# Patient Record
Sex: Male | Born: 1940
Health system: Southern US, Community
[De-identification: ages and names within clinical notes are randomized; demographics above are authoritative.]

## PROBLEM LIST (undated history)

## (undated) DIAGNOSIS — I714 Abdominal aortic aneurysm, without rupture, unspecified: Secondary | ICD-10-CM

## (undated) DIAGNOSIS — I2510 Atherosclerotic heart disease of native coronary artery without angina pectoris: Secondary | ICD-10-CM

## (undated) DIAGNOSIS — R32 Unspecified urinary incontinence: Secondary | ICD-10-CM

## (undated) DIAGNOSIS — N3642 Intrinsic sphincter deficiency (ISD): Secondary | ICD-10-CM

## (undated) DIAGNOSIS — C61 Malignant neoplasm of prostate: Secondary | ICD-10-CM

## (undated) DIAGNOSIS — E785 Hyperlipidemia, unspecified: Secondary | ICD-10-CM

## (undated) DIAGNOSIS — I10 Essential (primary) hypertension: Secondary | ICD-10-CM

## (undated) HISTORY — DX: Hyperlipidemia, unspecified: E78.5

## (undated) HISTORY — PX: BACK SURGERY: SHX140

## (undated) HISTORY — DX: Essential (primary) hypertension: I10

## (undated) HISTORY — PX: ANKLE FRACTURE SURGERY: SHX122

## (undated) HISTORY — PX: CORONARY ANGIOPLASTY: SHX604

## (undated) HISTORY — PX: CORONARY STENT PLACEMENT: SHX1402

## (undated) HISTORY — PX: CARDIAC CATHETERIZATION: SHX172

## (undated) HISTORY — PX: APPENDECTOMY: SHX54

## (undated) HISTORY — DX: Abdominal aortic aneurysm, without rupture: I71.4

## (undated) HISTORY — DX: Abdominal aortic aneurysm, without rupture, unspecified: I71.40

## (undated) HISTORY — PX: NOSE SURGERY: SHX723

## (undated) HISTORY — PX: CHOLECYSTECTOMY: SHX55

## (undated) HISTORY — DX: Atherosclerotic heart disease of native coronary artery without angina pectoris: I25.10

---

## 1996-02-11 HISTORY — PX: PROSTATE SURGERY: SHX751

## 2001-02-10 HISTORY — PX: CORONARY ANGIOPLASTY WITH STENT PLACEMENT: SHX49

## 2006-02-10 HISTORY — PX: ABDOMINAL AORTIC ANEURYSM REPAIR: SUR1152

## 2008-01-03 ENCOUNTER — Ambulatory Visit: Payer: Self-pay | Admitting: Cardiovascular Disease

## 2008-01-14 ENCOUNTER — Ambulatory Visit: Payer: Self-pay

## 2008-06-30 DIAGNOSIS — I2510 Atherosclerotic heart disease of native coronary artery without angina pectoris: Secondary | ICD-10-CM | POA: Insufficient documentation

## 2008-06-30 DIAGNOSIS — Z8679 Personal history of other diseases of the circulatory system: Secondary | ICD-10-CM | POA: Insufficient documentation

## 2008-06-30 DIAGNOSIS — Z8639 Personal history of other endocrine, nutritional and metabolic disease: Secondary | ICD-10-CM

## 2008-06-30 DIAGNOSIS — I10 Essential (primary) hypertension: Secondary | ICD-10-CM | POA: Insufficient documentation

## 2008-06-30 DIAGNOSIS — E119 Type 2 diabetes mellitus without complications: Secondary | ICD-10-CM

## 2008-06-30 DIAGNOSIS — Z862 Personal history of diseases of the blood and blood-forming organs and certain disorders involving the immune mechanism: Secondary | ICD-10-CM

## 2008-07-03 ENCOUNTER — Ambulatory Visit: Payer: Self-pay | Admitting: Cardiovascular Disease

## 2008-12-11 HISTORY — PX: OTHER SURGICAL HISTORY: SHX169

## 2008-12-15 ENCOUNTER — Telehealth: Payer: Self-pay | Admitting: Cardiovascular Disease

## 2008-12-16 ENCOUNTER — Encounter: Payer: Self-pay | Admitting: Cardiovascular Disease

## 2008-12-25 ENCOUNTER — Telehealth (INDEPENDENT_AMBULATORY_CARE_PROVIDER_SITE_OTHER): Payer: Self-pay | Admitting: *Deleted

## 2008-12-25 ENCOUNTER — Ambulatory Visit: Payer: Self-pay | Admitting: Cardiovascular Disease

## 2008-12-26 ENCOUNTER — Encounter (HOSPITAL_COMMUNITY): Admission: RE | Admit: 2008-12-26 | Discharge: 2009-02-06 | Payer: Self-pay | Admitting: Cardiovascular Disease

## 2008-12-26 ENCOUNTER — Ambulatory Visit: Payer: Self-pay | Admitting: Cardiology

## 2008-12-26 ENCOUNTER — Ambulatory Visit: Payer: Self-pay

## 2008-12-27 ENCOUNTER — Telehealth (INDEPENDENT_AMBULATORY_CARE_PROVIDER_SITE_OTHER): Payer: Self-pay | Admitting: *Deleted

## 2009-04-19 ENCOUNTER — Telehealth: Payer: Self-pay | Admitting: Cardiovascular Disease

## 2009-05-09 ENCOUNTER — Ambulatory Visit: Payer: Self-pay | Admitting: Cardiovascular Disease

## 2010-03-12 NOTE — Progress Notes (Signed)
Summary: sob , chestpain  Phone Note Call from Patient Call back at Home Phone [phone]   Caller: Spouse Reason for Call: Talk to Nurse Complaint: Chest Pain, Breathing Problems Details for Reason: Per pt wife calling pt hubsand c/o sob  with activities . cp. had surgery is nov Initial call taken by: Neil Crouch,  April 19, 2009 12:06 PM  Follow-up for Phone Call        S/W wife and they just wanted to make an appointment. The pt is having some SOB and CP at times. He is not having any symptoms today. Appointment made for 05/09/09.  They are aware to call back if he gets worse or feels like he needs to be seen sooner. Wife said this appointment would be fine with them.  Follow-up by: Barnett Abu, RN, BSN,  April 19, 2009 12:16 PM
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
## 2010-05-24 ENCOUNTER — Encounter: Payer: Self-pay | Admitting: Cardiovascular Disease

## 2010-05-28 ENCOUNTER — Ambulatory Visit: Payer: Medicare Other | Admitting: Cardiovascular Disease

## 2010-06-11 ENCOUNTER — Encounter: Payer: Self-pay | Admitting: *Deleted

## 2010-06-11 ENCOUNTER — Encounter: Payer: Self-pay | Admitting: Cardiovascular Disease

## 2010-06-11 ENCOUNTER — Ambulatory Visit (INDEPENDENT_AMBULATORY_CARE_PROVIDER_SITE_OTHER): Payer: Medicare Other | Admitting: Cardiovascular Disease

## 2010-06-11 DIAGNOSIS — R609 Edema, unspecified: Secondary | ICD-10-CM

## 2010-06-11 DIAGNOSIS — Z79899 Other long term (current) drug therapy: Secondary | ICD-10-CM

## 2010-06-11 DIAGNOSIS — Z01810 Encounter for preprocedural cardiovascular examination: Secondary | ICD-10-CM

## 2010-06-11 DIAGNOSIS — I739 Peripheral vascular disease, unspecified: Secondary | ICD-10-CM | POA: Insufficient documentation

## 2010-06-11 DIAGNOSIS — M79609 Pain in unspecified limb: Secondary | ICD-10-CM

## 2010-06-11 DIAGNOSIS — R079 Chest pain, unspecified: Secondary | ICD-10-CM

## 2010-06-11 DIAGNOSIS — M79606 Pain in leg, unspecified: Secondary | ICD-10-CM

## 2010-06-11 LAB — CBC WITH DIFFERENTIAL/PLATELET
Basophils Relative: 0.2 % (ref 0.0–3.0)
Eosinophils Absolute: 0 10*3/uL (ref 0.0–0.7)
Hemoglobin: 13.1 g/dL (ref 13.0–17.0)
MCHC: 35.3 g/dL (ref 30.0–36.0)
MCV: 91.9 fl (ref 78.0–100.0)
Monocytes Absolute: 0.3 10*3/uL (ref 0.1–1.0)
Neutro Abs: 3.6 10*3/uL (ref 1.4–7.7)
RBC: 4.03 Mil/uL — ABNORMAL LOW (ref 4.22–5.81)

## 2010-06-11 LAB — BASIC METABOLIC PANEL
BUN: 14 mg/dL (ref 6–23)
CO2: 26 mEq/L (ref 19–32)
Chloride: 100 mEq/L (ref 96–112)
Glucose, Bld: 343 mg/dL — ABNORMAL HIGH (ref 70–99)
Potassium: 4.4 mEq/L (ref 3.5–5.1)

## 2010-06-11 MED ORDER — POTASSIUM CHLORIDE 10 MEQ PO TBCR: 10.0000 meq | EXTENDED_RELEASE_TABLET | Freq: Every day | ORAL | Status: DC

## 2010-06-11 MED ORDER — FUROSEMIDE 20 MG PO TABS: 20.0000 mg | ORAL_TABLET | Freq: Every day | ORAL | Status: DC

## 2010-06-11 NOTE — Assessment & Plan Note (Signed)
Needs referral to [facility]  [facility] or [facility].  ABI's and [place] per their recommendation.

## 2010-06-11 NOTE — Patient Instructions (Addendum)
Your physician recommends that you schedule a follow-up appointment in: AFTER CATH  Your physician has requested that you have a cardiac catheterization. Cardiac catheterization is used to diagnose and/or treat various heart conditions. Doctors may recommend this procedure for a number of different reasons. The most common reason is to evaluate chest pain. Chest pain can be a symptom of coronary artery disease (CAD), and cardiac catheterization can show whether plaque is narrowing or blocking your heart's arteries. This procedure is also used to evaluate the valves, as well as measure the blood flow and oxygen levels in different parts of your heart. For further information please visit [facility]uge[facility]iesta[facility]tn. Please follow instruction sheet, as given.   Your physician has recommended you make the following change in your medication: ADD FUROSEMIDE 20 MG EVERY DAY AND KLOR CON 10 MEQ EVERY DAY.  You have been referred to VASCULAR  DR EARLY ,[place]   Your physician recommends that you return for lab work in: [place] [facility] [place] V72.81

## 2010-06-11 NOTE — Assessment & Plan Note (Signed)
Well controlled.  Continue current medications and low sodium Dash type diet.    

## 2010-06-11 NOTE — Assessment & Plan Note (Signed)
Add lasix and potassium  Discussed weight loss, compression hose and elevating legs at end of day

## 2010-06-11 NOTE — Assessment & Plan Note (Signed)
Previous stents to circumflex.  Recurrent SSCP relieved with nitro.  Cath to be arranged

## 2010-06-11 NOTE — Progress Notes (Signed)
Max Anderson is seen today for F/U of CAD and PVC   He has had a AAA repair and left popliteal aneurysm reapair. Max Anderson  He has CAD with previous stenting of the circ in [place]/[facility] ? 2006 He has never been cathed here in [place].  He is very sedentary and only walks from "den to [employer]    He had a normal myovue here in 12/2008  He continues to have exertional chest pain.  Taking nitro fairly regularly.  H2 blocker has not helped as much as we thought it should.  Given known CAD with "angina" at low work load needs cath.  Hopefully can do from right radial given PVD/AAA repair and obesity. Also needs vascular referral.  Does not want to F/U at [facility].  Continued pain and LE edema in right leg were popliteal aneurysm was.  Needs diuretic for edema related to surgery and obesity.  ROS: Denies fever, malais, weight loss, blurry vision, decreased visual acuity, cough, sputum, SOB, hemoptysis, pleuritic pain, palpitaitons, heartburn, abdominal pain, melena, lower extremity edema, claudication, or rash.   General: Affect appropriate Healthy:  appears stated age 70: normal Neck supple with no adenopathy JVP normal no bruits no thyromegaly Lungs clear with no wheezing and good diaphragmatic motion Heart:  S1/S2 no murmur,rub, gallop or click PMI normal Abdomen: benighn, BS positve, no tenderness,  AAA repair no bruit.  No HSM or HJR Distal pulses intact with no bruits Bilateral  edema Neuro non-focal Skin warm and dry No muscular weakness   Current Outpatient Prescriptions  Medication Sig Dispense Refill  . allopurinol (ZYLOPRIM) 300 MG tablet Take 300 mg by mouth daily.        Max Anderson amLODipine-olmesartan (AZOR) 5-20 MG per tablet Take 1 tablet by mouth daily.        Max Anderson aspirin 81 MG tablet Take 81 mg by mouth daily.        Max Anderson atenolol (TENORMIN) 25 MG tablet Take 25 mg by mouth daily.        . clopidogrel (PLAVIX) 75 MG tablet Take 75 mg by mouth daily.        Max Anderson gemfibrozil (LOPID) 600 MG tablet  Take 600 mg by mouth 2 (two) times daily before a meal.        . HYDROcodone-acetaminophen (VICODIN) 5-500 MG per tablet Take 1 tablet by mouth every 6 (six) hours as needed.        Max Anderson PARoxetine (PAXIL) 40 MG tablet Take 40 mg by mouth every morning.          Allergies  Review of patient's allergies indicates not on file.  Electrocardiogram:  NSR 75 poor R wave progression  Assessment and Plan

## 2010-06-18 ENCOUNTER — Inpatient Hospital Stay (HOSPITAL_BASED_OUTPATIENT_CLINIC_OR_DEPARTMENT_OTHER)
Admission: RE | Admit: 2010-06-18 | Discharge: 2010-06-18 | Disposition: A | Discharge status: Home / Self Care | Payer: Medicare Other | Source: Ambulatory Visit | Attending: Cardiovascular Disease | Admitting: Cardiovascular Disease

## 2010-06-18 DIAGNOSIS — I2510 Atherosclerotic heart disease of native coronary artery without angina pectoris: Secondary | ICD-10-CM

## 2010-06-18 DIAGNOSIS — I209 Angina pectoris, unspecified: Secondary | ICD-10-CM | POA: Insufficient documentation

## 2010-06-18 DIAGNOSIS — Z9861 Coronary angioplasty status: Secondary | ICD-10-CM | POA: Insufficient documentation

## 2010-06-18 LAB — POCT I-STAT GLUCOSE
Glucose, Bld: 185 mg/dL — ABNORMAL HIGH (ref 70–99)
Operator id: 194801

## 2010-06-19 ENCOUNTER — Telehealth: Payer: Self-pay | Admitting: Cardiology

## 2010-06-19 NOTE — Telephone Encounter (Signed)
Per Dr. [name], patient is set up for an intervention with Dr. Burt Knack on 06/21/10 @ 10:30am in the main cath lab. He is aware.

## 2010-06-21 ENCOUNTER — Observation Stay (HOSPITAL_COMMUNITY)
Admission: RE | Admit: 2010-06-21 | Discharge: 2010-06-21 | Disposition: A | Discharge status: Home / Self Care | Payer: Medicare Other | Source: Ambulatory Visit | Attending: Cardiovascular Disease | Admitting: Cardiovascular Disease

## 2010-06-21 DIAGNOSIS — Z9861 Coronary angioplasty status: Secondary | ICD-10-CM | POA: Insufficient documentation

## 2010-06-21 DIAGNOSIS — I2541 Coronary artery aneurysm: Secondary | ICD-10-CM | POA: Insufficient documentation

## 2010-06-21 DIAGNOSIS — Z01810 Encounter for preprocedural cardiovascular examination: Secondary | ICD-10-CM | POA: Insufficient documentation

## 2010-06-21 DIAGNOSIS — Z01812 Encounter for preprocedural laboratory examination: Secondary | ICD-10-CM | POA: Insufficient documentation

## 2010-06-21 DIAGNOSIS — I2510 Atherosclerotic heart disease of native coronary artery without angina pectoris: Principal | ICD-10-CM | POA: Insufficient documentation

## 2010-06-21 DIAGNOSIS — Z79899 Other long term (current) drug therapy: Secondary | ICD-10-CM | POA: Insufficient documentation

## 2010-06-21 DIAGNOSIS — I739 Peripheral vascular disease, unspecified: Secondary | ICD-10-CM | POA: Insufficient documentation

## 2010-06-21 LAB — CBC
Platelets: 156 10*3/uL (ref 150–400)
RDW: 14.9 % (ref 11.5–15.5)
WBC: 5.8 10*3/uL (ref 4.0–10.5)

## 2010-06-21 LAB — BASIC METABOLIC PANEL
Chloride: 98 mEq/L (ref 96–112)
Creatinine, Ser: 1.2 mg/dL (ref 0.4–1.5)
GFR calc Af Amer: >60 mL/min (ref >60)
Potassium: 4.8 mEq/L (ref 3.5–5.1)
Sodium: 138 mEq/L (ref 135–145)

## 2010-06-21 LAB — GLUCOSE, CAPILLARY
Glucose-Capillary: 262 mg/dL — ABNORMAL HIGH (ref 70–99)
Glucose-Capillary: 189 mg/dL — ABNORMAL HIGH (ref 70–99)

## 2010-06-21 LAB — POCT ACTIVATED CLOTTING TIME: Activated Clotting Time: 240 seconds

## 2010-06-25 ENCOUNTER — Encounter (INDEPENDENT_AMBULATORY_CARE_PROVIDER_SITE_OTHER): Payer: Medicare Other | Admitting: Vascular Surgery

## 2010-06-25 ENCOUNTER — Encounter (INDEPENDENT_AMBULATORY_CARE_PROVIDER_SITE_OTHER): Payer: Medicare Other

## 2010-06-25 ENCOUNTER — Other Ambulatory Visit: Payer: Self-pay | Admitting: Vascular Surgery

## 2010-06-25 DIAGNOSIS — I724 Aneurysm of artery of lower extremity: Secondary | ICD-10-CM

## 2010-06-25 DIAGNOSIS — I70219 Atherosclerosis of native arteries of extremities with intermittent claudication, unspecified extremity: Secondary | ICD-10-CM

## 2010-06-25 DIAGNOSIS — I714 Abdominal aortic aneurysm, without rupture: Secondary | ICD-10-CM

## 2010-06-25 DIAGNOSIS — Z48812 Encounter for surgical aftercare following surgery on the circulatory system: Secondary | ICD-10-CM

## 2010-06-25 DIAGNOSIS — M79609 Pain in unspecified limb: Secondary | ICD-10-CM
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
## 2010-06-26 NOTE — Consult Note (Signed)
NEW PATIENT CONSULTATION  RUFFIN, BERZINS DOB:  July 27, 1940                                       06/25/2010 CHART#:[id number]  This patient presents today for evaluation of his extensive peripheral vascular occlusive disease.  He has had all his prior treatments at [facility].  He lives [place] and had his initial care in [place] with referral to [place].  He has a long history of insulin dependent diabetes dating back nearly 25 years, has hypertension, elevated cholesterol.  He has had multiple prior coronary stents.  PRIOR SURGICAL HISTORY:  Prostate cancer surgery, extensive back surgery, stent graft repair of abdominal aortic aneurysm in 2007 at [facility] and also above-knee to below-knee popliteal bypass for right popliteal artery aneurysm at [facility] in November 2010.  He reports difficulty with walking since the right leg bypass with difficulty in raising his foot. He does have a history of prior appendectomy and cholecystectomy.  He also had multiple ankle surgeries for fractured ankle on the right.  SOCIAL HISTORY:  He is married with 3 children.  He does not smoke, having quit in 1990.  He does not drink alcohol.  FAMILY HISTORY:  Myocardial infarction in his mother; father died at a young age, 76.  He has diabetes and heart disease in his brother and sisters.  REVIEW OF SYSTEMS:  Weight gain up to 289 pounds, he is 5 feet 10 inches tall. VASCULAR:  Positive for pain in his legs with walking. CARDIAC:  Chest pain, chest tightness, shortness of breath with exertion. NEUROLOGIC:  Dizziness. URINARY:  Does have urinary prosthesis. ENT:  Change in eyesight and hearing. MUSCULOSKELETAL:  Positive for arthritis, joint pain, and muscle pain. PSYCHIATRIC:  Depression and anxiety. SKIN:  Negative. Other review of systems is negative.  PHYSICAL EXAMINATION:  An obese white male, appearing stated age, in no acute distress.  Blood pressure 125/77,  pulse 70, respirations 18. HEENT:  Normal.  His carotid arteries are without bruits bilaterally. Has 2+ radial, 2+ femoral pulses.  He does have palpable popliteal pulses bilaterally.  Chest:  Clear without rales, rhonchi or wheezes. Heart:  Regular rate and rhythm.  Abdomen:  Obese.  I do not feel an aneurysm.  I do not feel any masses.  He has no tenderness. Musculoskeletal:  Shows no major deformity or cyanosis.  Neurologic:  No focal weakness or paresthesias.  Skin:  Without ulcers or rashes.  I do have records for review from Dr. [name].  He did have a recent cath showing some recurrent stenosis in his LAD with some aneurysmal change.  He underwent noninvasive vascular laboratory studies in our office and this reveals normal ankle arm index bilaterally.  He does have some widening of the popliteal artery at the lower segment of his bypass on the right but not really aneurysmal.  He does have normal triphasic waveforms in his lower extremities.  On further discussion with the patient and his wife, he apparently has had no recent CT scan follow-up of the stent graft repair of abdominal aortic aneurysm done at [facility] in 2007.  I explained that the accepted protocol for this is lifelong serial follow-up with CT.  We will schedule this at his convenience to be done at [facility] and will contact him regarding this.  Assuming this is normal, we would recommend CT scan  every 2 years to rule out any change in his aneurysm. We will notify him following his CT.    Rosetta Posner, M.D. Electronically Signed  TFE/MEDQ  D:  06/25/2010  T:  06/26/2010  Job:  [id number]  cc:   Wallis Bamberg. [name], MD, [facility]. Willey Blade, MD

## 2010-06-27 ENCOUNTER — Ambulatory Visit (HOSPITAL_COMMUNITY): Payer: Medicare Other

## 2010-07-03 NOTE — Procedures (Unsigned)
BYPASS GRAFT EVALUATION  INDICATION:  Follow up right popliteal aneurysm and right femoral- popliteal bypass graft.  HISTORY: Diabetes:  Yes. Cardiac: Hypertension:  Yes. Smoking:  Previous. Previous Surgery:  AAA repair in 2008, right femoral-to-popliteal bypass graft in 2010.  SINGLE LEVEL ARTERIAL EXAM                              RIGHT              LEFT Brachial:                    124                140 Anterior tibial:             148                146 Posterior tibial:            153                160 Peroneal: Ankle/brachial index:        1.09               1.14  PREVIOUS ABI:  Date:  RIGHT:  LEFT:  LOWER EXTREMITY BYPASS GRAFT DUPLEX EXAM:  DUPLEX: 1. No hemodynamically significant stenosis/plaque identified in the     right lower extremity arterial system. 2. Previous popliteal aneurysm appears occluded and measures 1.12 cm X     1.24 cm. 3. Mild dilatation noted at the distal anastomosis of the right     femoral-to-popliteal bypass graft measuring 1.28 cm X 1.44 cm     without intramural thrombus present.  IMPRESSION: 1. No evidence of stenosis, as noted above. 2. Dilatations are present, as noted above. 3. Ankle brachial indices are >0.95 and are considered normal.      ___________________________________________ Rosetta Posner, M.D.  SH/MEDQ  D:  06/25/2010  T:  06/25/2010  Job:  [id number]

## 2010-07-03 NOTE — Cardiovascular Report (Signed)
Max Anderson, Max Anderson               ACCOUNT NO.:  [id number]  MEDICAL RECORD NO.:  [id number]          PATIENT TYPE:  LOCATION:                                 FACILITY:  PHYSICIAN:  Arletha Marschke C. [name], MD, FACCDATE OF BIRTH:  Aug 07, 1940  DATE OF PROCEDURE:  06/18/2010 DATE OF DISCHARGE:                           CARDIAC CATHETERIZATION   A 70 year old patient previous cardiac care in [place].  The patient has had previous stents.  We did not know all the details of where the stents had previously been placed.  He presented to our office with classic angina.  He is status post AAA repair and popliteal aneurysm repair.  We elected to use 5-French system from the right radial artery.  The patient received 4500 units of heparin, 3 mg of verapamil, 25 mcg of fentanyl and 2 mg of Versed.  Standard JR-4 and JL-3.5 catheters were used to engage the coronaries. A standard pigtail was used to cross the valve.  Left main coronary artery had a 20% discrete stenosis.  Left anterior descending artery had a 70-80% discrete stenosis.  Right after this stenosis was a very large aneurysmal segment to the proximal LAD.  The mid LAD had diffuse 40-50% tubular disease inside of a small stent.  The distal LAD had 20-30% multiple discrete lesions and was a large vessel that reached the apex.  The first, second, and third diagonal branches had minor disease and the mid vessel stent appeared to be placed just distal to the first diagonal branch.  Circumflex coronary artery was nondominant.  There was a 40% discrete lesion at the takeoff of the first obtuse marginal branch.  The second obtuse marginal branch were what would be the continuation of the AV groove branch was a very large vessel.  There was a widely patent stent in this AV groove or second obtuse marginal branch.  There was some tortuosity at the takeoff of the first obtuse marginal branch but I do not believe there was a significant  stenosis.  They are probably only in the 40% range at the bifurcation with the stent being widely patent distal to that.  The right coronary artery was dominant.  There also appeared to be a stent in the proximal portion of the right coronary artery.  There was 20-30% residual disease in the proximal portion, the midvessel had 40% tubular disease.  Interestingly, the entire right coronary artery also had aneurysmal segments.  The distal right coronary artery had 30-40% multiple discrete lesions surrounding two aneurysmal segments.  There was a large posterolateral branch and PDA without significant disease.  RAO ventriculography:  RAO ventriculography showed hyperdynamic function.  EF was 65%.  There was no gradient across the aortic valve and no MR.  Aortic pressure was 148/68, LV pressure was 148/18.  IMPRESSION:  I have asked Dr. Gerrit Halls to review the films.  I suspect that they will be able to stent the proximal LAD although the aneurysmal segment presents problem.  He has done well with his other stents.  He must have a genotype for aneurysm since he has also had aneurysms in his abdomen  and popliteal artery.  I wonder if he also should have an MRA of the central circulation to make sure he is not risk for CNS bleed.  He tolerated the procedure well.  At the end of the case, the TR band was placed with good hemostasis.     Max Anderson. [name], MD, [facility]     PCN/MEDQ  D:  06/18/2010  T:  06/18/2010  Job:  [id number]  Electronically Signed by Jenkins Rouge MD [facility] on 07/03/2010 03:41:52 PM

## 2010-07-09 ENCOUNTER — Encounter (HOSPITAL_COMMUNITY): Payer: Self-pay

## 2010-07-09 ENCOUNTER — Ambulatory Visit (HOSPITAL_COMMUNITY)
Admission: RE | Admit: 2010-07-09 | Discharge: 2010-07-09 | Disposition: A | Discharge status: Home / Self Care | Payer: Medicare Other | Source: Ambulatory Visit | Attending: Vascular Surgery | Admitting: Vascular Surgery

## 2010-07-09 DIAGNOSIS — I714 Abdominal aortic aneurysm, without rupture, unspecified: Secondary | ICD-10-CM | POA: Insufficient documentation

## 2010-07-09 MED ORDER — IOHEXOL 350 MG/ML SOLN
100.0000 mL | Freq: Once | INTRAVENOUS | Status: AC | PRN
  Administered 2010-07-09: 100 mL via INTRAVENOUS

## 2010-07-10 ENCOUNTER — Encounter: Payer: Self-pay | Admitting: Cardiovascular Disease

## 2010-07-10 ENCOUNTER — Encounter: Payer: Medicare Other | Admitting: Cardiovascular Disease

## 2010-07-16 ENCOUNTER — Other Ambulatory Visit (HOSPITAL_COMMUNITY): Payer: Self-pay | Admitting: Internal Medicine

## 2010-07-16 ENCOUNTER — Ambulatory Visit (HOSPITAL_COMMUNITY)
Admission: RE | Admit: 2010-07-16 | Discharge: 2010-07-16 | Disposition: A | Discharge status: Home / Self Care | Payer: Medicare Other | Source: Ambulatory Visit | Attending: Internal Medicine | Admitting: Internal Medicine

## 2010-07-16 DIAGNOSIS — W19XXXA Unspecified fall, initial encounter: Secondary | ICD-10-CM

## 2010-07-16 DIAGNOSIS — M25562 Pain in left knee: Secondary | ICD-10-CM

## 2010-07-16 DIAGNOSIS — M25469 Effusion, unspecified knee: Secondary | ICD-10-CM | POA: Insufficient documentation

## 2010-07-16 DIAGNOSIS — M25569 Pain in unspecified knee: Secondary | ICD-10-CM | POA: Insufficient documentation

## 2010-07-23 ENCOUNTER — Ambulatory Visit (INDEPENDENT_AMBULATORY_CARE_PROVIDER_SITE_OTHER): Payer: Medicare Other | Admitting: Cardiovascular Disease

## 2010-07-23 ENCOUNTER — Encounter: Payer: Self-pay | Admitting: Cardiovascular Disease

## 2010-07-23 DIAGNOSIS — I10 Essential (primary) hypertension: Secondary | ICD-10-CM

## 2010-07-23 DIAGNOSIS — R079 Chest pain, unspecified: Secondary | ICD-10-CM

## 2010-07-23 DIAGNOSIS — I2510 Atherosclerotic heart disease of native coronary artery without angina pectoris: Secondary | ICD-10-CM

## 2010-07-23 DIAGNOSIS — Z8679 Personal history of other diseases of the circulatory system: Secondary | ICD-10-CM

## 2010-07-23 MED ORDER — ISOSORBIDE MONONITRATE ER 30 MG PO TB24: 30.0000 mg | ORAL_TABLET | Freq: Every day | ORAL | Status: DC

## 2010-07-23 NOTE — Assessment & Plan Note (Signed)
F/U Dr Donnetta Hutching.  CT end of may stable with no endoleak

## 2010-07-23 NOTE — Assessment & Plan Note (Signed)
Well controlled.  Continue current medications and low sodium Dash type diet.    

## 2010-07-23 NOTE — Patient Instructions (Signed)
Your physician recommends that you schedule a follow-up appointment in: 3 months with Dr. [name]  Your physician has requested that you have a lexiscan myoview. For further information please visit [email or web address]. Please follow instruction sheet, as given. In 3 months when you see Dr. [name].  Your physician has recommended you make the following change in your medication: START IMDUR 30mg  daily.

## 2010-07-23 NOTE — Progress Notes (Signed)
Max Anderson is seen today for F/U of CAD and PVC He has had a AAA repair and left popliteal aneurysm reapair. Max Anderson He has CAD with previous stenting of the circ in [place]/[facility] ? 2006  He has never been cathed here in [place]. He is very sedentary and only walks from "den to [employer] He had a normal myovue here in 12/2008 He continues to have exertional chest pain. Taking nitro fairly regularly. H2 blocker has not helped as much as we thought it should. Given known CAD with "angina" at low work load cath was done first week in May.  He had a significant  promimal LAD lesion with proximal aneurysm 3 days latter I had Dr Burt Knack evaluate with flow wire and he did not meed criteria for intervention.  Still concerned Had had to take nitro 2 times since cath.  Will add long acting nitro and F/U lexiscan only myovue in 3 months.  Will likely need CABG in the next year or two at the latest  Has seen Dr Early for vascular F/U  Reviewed notes. CT 5/29 with residula 3.4 cm aneurysm sac and no endoleak  Does not want to F/U at [facility]. Continued pain and LE edema in right leg were popliteal aneurysm was. Needs diuretic for edema related to surgery and obesity.  Reviewed angiograms with patient and wife.    ROS: Denies fever, malais, weight loss, blurry vision, decreased visual acuity, cough, sputum, SOB, hemoptysis, pleuritic pain, palpitaitons, heartburn, abdominal pain, melena, lower extremity edema, claudication, or rash.  All other systems reviewed and negative  General: Affect appropriate Chronically ill appearing HEENT: normal Neck supple with no adenopathy JVP normal no bruits no thyromegaly Lungs clear with no wheezing and good diaphragmatic motion Heart:  S1/S2 no murmur,rub, gallop or click PMI normal Abdomen: benighn, BS positve, no tenderness, no AAA no bruit.  No HSM or HJR Distal pulses intact with no bruits No edema Neuro non-focal Skin warm and dry No muscular weakness Obese with difficulty  ambulating   Current Outpatient Prescriptions  Medication Sig Dispense Refill  . allopurinol (ZYLOPRIM) 300 MG tablet Take 300 mg by mouth daily.        Max Anderson amLODipine-olmesartan (AZOR) 5-20 MG per tablet Take 1 tablet by mouth daily.        Max Anderson aspirin 81 MG tablet Take 81 mg by mouth daily.        Max Anderson atenolol (TENORMIN) 25 MG tablet Take 25 mg by mouth daily.        Max Anderson buPROPion (BUPROBAN) 150 MG 12 hr tablet Take 150 mg by mouth 2 (two) times daily.        . clonazePAM (KLONOPIN) 0.5 MG tablet 0.5 mg as needed.        . clopidogrel (PLAVIX) 75 MG tablet Take 75 mg by mouth daily.        . furosemide (LASIX) 20 MG tablet Take 1 tablet (20 mg total) by mouth daily.  30 tablet  11  . HYDROcodone-acetaminophen (LORTAB 5) 5-500 MG per tablet Take 1 tablet by mouth every 6 (six) hours as needed.        . insulin NPH-insulin regular (HUMULIN 70/30) (70-30) 100 UNIT/ML injection 3 (three) times daily. 65-45-75       . nitroGLYCERIN (NITROSTAT) 0.4 MG SL tablet Place 0.4 mg under the tongue every 5 (five) minutes as needed.        Max Anderson PARoxetine (PAXIL) 40 MG tablet Take 40 mg by mouth every morning.        Max Anderson  potassium chloride (KLOR-CON 10) 10 MEQ CR tablet Take 1 tablet (10 mEq total) by mouth daily.  30 tablet  11    Allergies  Niaspan and Tolectin  Electrocardiogram:  NSR 75 Poor R wave progression no acute changes  Assessment and Plan

## 2010-07-23 NOTE — Assessment & Plan Note (Signed)
Discussed and reviewed with Dr Burt Knack.  Add long acting nitro.  FFR not clearing flow limiting .  Having some angina  ECG ok.  Has lost about 10 lbs since cath.  Lexiscan only myovue to R/O ischemia anteriorly in 3 months.  Low threshold for CABG if symptoms worsen

## 2010-07-24 ENCOUNTER — Encounter: Payer: Self-pay | Admitting: *Deleted

## 2010-07-31 ENCOUNTER — Other Ambulatory Visit (HOSPITAL_COMMUNITY): Payer: Medicare Other | Admitting: Radiology

## 2010-07-31 ENCOUNTER — Ambulatory Visit (HOSPITAL_COMMUNITY): Payer: Medicare Other | Attending: Cardiovascular Disease | Admitting: Radiology

## 2010-07-31 DIAGNOSIS — R0602 Shortness of breath: Secondary | ICD-10-CM

## 2010-07-31 DIAGNOSIS — I2510 Atherosclerotic heart disease of native coronary artery without angina pectoris: Secondary | ICD-10-CM

## 2010-07-31 DIAGNOSIS — R079 Chest pain, unspecified: Secondary | ICD-10-CM | POA: Insufficient documentation

## 2010-07-31 MED ORDER — TECHNETIUM TC 99M TETROFOSMIN IV KIT
11.0000 | PACK | Freq: Once | INTRAVENOUS | Status: AC | PRN
  Administered 2010-07-31: 11 via INTRAVENOUS

## 2010-07-31 MED ORDER — TECHNETIUM TC 99M TETROFOSMIN IV KIT
33.0000 | PACK | Freq: Once | INTRAVENOUS | Status: AC | PRN
  Administered 2010-07-31: 33 via INTRAVENOUS

## 2010-07-31 MED ORDER — REGADENOSON 0.4 MG/5ML IV SOLN
0.4000 mg | Freq: Once | INTRAVENOUS | Status: AC
  Administered 2010-07-31: 0.4 mg via INTRAVENOUS

## 2010-07-31 NOTE — Progress Notes (Signed)
[facility] NUCLEAR MED [place] [address] [phone]  Cardiology Nuclear Med Study  Max Anderson is a 70 y.o. male [id number] 12-04-1940   Nuclear Med Background Indication for Stress Test:  Evaluation for Ischemia and Stent Patency History: 06/19/10 Heart Catheterization: mod proximal 70-80% LAD stenosis, '10 Myocardial Perfusion Study: NL and 06/07 Stents: RCA/CFX, 2008 AAA (L) pop repair, 05/29 showing residual 3.4cm aneurysm, 2010 Femoral/Pop Bypass/Graft Cardiac Risk Factors: Family History - CAD, Hypertension, IDDM Type 2, Lipids and PVD  Symptoms:  Chest Pain, DOE, Fatigue, Fatigue with Exertion, Light-Headedness and SOB   Nuclear Pre-Procedure Caffeine/Decaff Intake:  None NPO After: 5:00pm   Lungs:  clear IV 0.9% NS with Angio Cath:  20g  IV Site: R Antecubital  IV Started by:  Irven Baltimore, RN  Chest Size (in):  56 Cup Size: n/a  Height: 5\' 10"  (1.778 m)  Weight:  278 lb (126.1 kg)  BMI:  Body mass index is 39.89 kg/(m^2). Tech Comments:  Held atenolol x 24 hrs    Nuclear Med Study 1 or 2 day study: 1 day  Stress Test Type:  Carlton Adam  Reading MD: Dola Argyle, MD  Order Authorizing Provider:  P.Nishan  Resting Radionuclide: Technetium 56m Tetrofosmin  Resting Radionuclide Dose: 11 mCi   Stress Radionuclide:  Technetium 25m Tetrofosmin  Stress Radionuclide Dose: 33 mCi           Stress Protocol Rest HR: 64 Stress HR: 72  Rest BP: 136/68 Stress BP: 146/64  Exercise Time (min): n/a METS: n/a   Predicted Max HR: 151 bpm % Max HR: 48.34 bpm Rate Pressure Product: 10658   Dose of Adenosine (mg):  n/a Dose of Lexiscan: 0.4 mg  Dose of Atropine (mg): n/a Dose of Dobutamine: n/a mcg/kg/min (at max HR)  Stress Test Technologist: Perrin Maltese, EMT-P  Nuclear Technologist:  Charlton Amor, CNMT     Rest Procedure:  Myocardial perfusion imaging was performed at rest 45 minutes following the intravenous  administration of Technetium 10m Tetrofosmin. Rest ECG: NSR  Stress Procedure:  The patient received IV Lexiscan 0.4 mg over 15-seconds.  Technetium 27m Tetrofosmin injected at 30-seconds.  There were no significant changes with Lexiscan.  Quantitative spect images were obtained after a 45 minute delay. Stress ECG: No significant change from baseline ECG  QPS Raw Data Images:  Patient motion noted; appropriate software correction applied. Stress Images:  No diagnostic abnormalities. Rest Images:  Same as stress Subtraction (SDS):  No evidence of ischemia. Transient Ischemic Dilatation (Normal <1.22):  1.05 Lung/Heart Ratio (Normal <0.45):  .36  Quantitative Gated Spect Images QGS EDV:  119 ml QGS ESV:  41 ml QGS cine images:  Normal Wall Motion QGS EF: 66%  Impression Exercise Capacity:  Lexiscan with no exercise. BP Response:  Normal blood pressure response. Clinical Symptoms:  SOB ECG Impression:  No significant ST segment change suggestive of ischemia. Comparison with Prior Nuclear Study: No significant change from previous study  Overall Impression:  Normal stress nuclear study.   Dola Argyle

## 2010-08-01 NOTE — Progress Notes (Signed)
Copy nuc med report routed to Dr. [name] 08/01/10 Charlton Amor

## 2010-08-01 NOTE — Cardiovascular Report (Signed)
  NAME:  AVARY, GOODE NO.:  [id number]  MEDICAL RECORD NO.:  [id number]           PATIENT TYPE:  O  LOCATION:  6522                         FACILITY:  [facility]  PHYSICIAN:  Juanda Bond. Burt Knack, MD  DATE OF BIRTH:  October 31, 1940  DATE OF PROCEDURE:  06/21/2010 DATE OF DISCHARGE:  06/21/2010                           CARDIAC CATHETERIZATION   PROCEDURE:  Pressure wire analysis of the LAD.  PROCEDURAL INDICATIONS:  Mr. Monaco is a morbidly obese 70 year old gentleman with coronary artery disease and peripheral arterial disease. He has had previous coronary stenting and presented to see Dr. [name] with progressive angina.  He underwent diagnostic catheterization earlier this week demonstrating moderately tight stenosis of the proximal LAD leading into an aneurysmal segment of LAD.  He had patency of stents in this left circumflex and further down in the mid-LAD.  The right coronary artery has ectasia and moderate diffuse plaque without high-grade stenosis.  After review of his films, we elected to bring him in for pressure wire analysis of the LAD to see if he was ischemic in that region.  I did not feel that he was a good candidate for percutaneous stenting because we would have to treat the lesion into the aneurysmal segment.  PROCEDURAL DETAILS:  Risks and indications of procedure were reviewed with the patient, informed consent was obtained.  The right wrist was prepped, draped, and anesthetized with 1% lidocaine using modified Seldinger technique.  A 6-French sheath was placed in the right radial artery.  7000 units of unfractionated heparin was administered intravenously, 3 mg of verapamil was given through the sheath.  A JL-4 cm guide catheter was inserted.  The ACT was 240 seconds.  The pressure wire was zeroed and normalized then advanced easily beyond the lesion and beyond the aneurysmal segment into the distal LAD.  Intravenous adenosine was administered for  about 3 minutes and at peak hyperemia, the FFR initially was 0.83 and the second recording was 0.81.  The patient tolerated the procedure well.  The wire was removed.  Further angiographic images were obtained, and the guide catheter was removed without difficulty.  A TR band was placed for radial hemostasis.  There were no immediate complications.  FINAL IMPRESSION:  Moderate proximal LAD stenosis just below the threshold for hemodynamic significance.  DISCUSSION:  The patient has a borderline FFR, just below the threshold for hemodynamic significance, which is thought to be in the range of 0.75-0.80.  He will be managed medically and undergo serial followup with Dr. [name].  There eventually may be some consideration for coronary bypass surgery.    Juanda Bond. Burt Knack, MD    MDC/MEDQ  D:  06/21/2010  T:  06/22/2010  Job:  [id number]  cc:   Wallis Bamberg. [name], MD, [facility]  Electronically Signed by Sherren Mocha MD on 08/01/2010 07:31:39 AM

## 2010-08-12 NOTE — Progress Notes (Signed)
Spoke with pt wife, she is aware of normal stress test results. She reports the pt had told her to tell us to schedule his heart surgery because he feels so bad. They would like dr [name] to call and discuss with them. The number to corben is [phone]. Will make de nishan aware Fredia Beets

## 2010-08-12 NOTE — Progress Notes (Signed)
pt aware of results Max Anderson  

## 2010-08-22 NOTE — Patient Instructions (Signed)
Per dr [name] follow up appt made Max Anderson

## 2010-08-28 ENCOUNTER — Ambulatory Visit (INDEPENDENT_AMBULATORY_CARE_PROVIDER_SITE_OTHER): Payer: Medicare Other | Admitting: Cardiovascular Disease

## 2010-08-28 ENCOUNTER — Encounter: Payer: Self-pay | Admitting: Cardiovascular Disease

## 2010-08-28 DIAGNOSIS — I10 Essential (primary) hypertension: Secondary | ICD-10-CM

## 2010-08-28 DIAGNOSIS — R609 Edema, unspecified: Secondary | ICD-10-CM

## 2010-08-28 DIAGNOSIS — R079 Chest pain, unspecified: Secondary | ICD-10-CM

## 2010-08-28 DIAGNOSIS — E119 Type 2 diabetes mellitus without complications: Secondary | ICD-10-CM

## 2010-08-28 DIAGNOSIS — Z8679 Personal history of other diseases of the circulatory system: Secondary | ICD-10-CM

## 2010-08-28 DIAGNOSIS — I2510 Atherosclerotic heart disease of native coronary artery without angina pectoris: Secondary | ICD-10-CM

## 2010-08-28 MED ORDER — ISOSORBIDE MONONITRATE ER 60 MG PO TB24: 60.0000 mg | ORAL_TABLET | Freq: Every day | ORAL | Status: DC

## 2010-08-28 MED ORDER — NITROGLYCERIN 0.4 MG SL SUBL: 0.4000 mg | SUBLINGUAL_TABLET | SUBLINGUAL | Status: DC | PRN

## 2010-08-28 MED ORDER — ATENOLOL 50 MG PO TABS: 50.0000 mg | ORAL_TABLET | Freq: Every day | ORAL | Status: DC

## 2010-08-28 NOTE — Progress Notes (Signed)
Max Anderson is seen today for F/U of CAD and PVC He has had a AAA repair and left popliteal aneurysm reapair. [name] He has CAD with previous stenting of the circ in [place]/[facility] ? 2006  He has never been cathed here in [place]. He is very sedentary and only walks from "den to [employer] He had a normal myovue here in 12/2008 He continues to have exertional chest pain. Taking nitro fairly regularly. H2 blocker has not helped as much as we thought it should. Given known CAD with "angina" at low work load cath was done first week in May. He had a significant promimal LAD lesion with proximal aneurysm 3 days latter I had Max Anderson evaluate with flow wire and he did not meed criteria for intervention. F/U myovue 6/20 read by Max Anderson as normal.  To my eye small inferior infarct but no ischemia especially in the anterior territory Has seen Max Anderson for vascular F/U Reviewed notes. CT 5/29 with residula 3.4 cm aneurysm sac and no endoleak Does not want to F/U at [facility]. Continued pain and LE edema in right leg were popliteal aneurysm was. Needs diuretic for edema related to surgery and obesity.  Long discussion with Max Anderson.  I dont think it is time for CABG yeat with no documented ischemia in LAD territory and patent stents in RCA and circ.  He has lost 12 lbs since May and discussed continue low carb diet and weight loss efforts which would make CABG much less risky.    Will continue to optimize meds and increase nitrates and beta blocker  Reviewed: Cath 06/22/10,  Reviewed Flow Wire results 06/25/10 Reviewed Myovue 07/31/10  ROS: Denies fever, malais, weight loss, blurry vision, decreased visual acuity, cough, sputum, SOB, hemoptysis, pleuritic pain, palpitaitons, heartburn, abdominal pain, melena, , claudication, or rash.  All other systems reviewed and negative  General: Affect appropriate Obese chronically ill HEENT: normal Neck supple with no adenopathy JVP normal no bruits no thyromegaly Lungs clear with no  wheezing and good diaphragmatic motion Heart:  S1/S2 SEM murmur,rub, gallop or click PMI normal Abdomen: benighn, BS positve, no tenderness, no AAA no bruit.  No HSM or HJR Distal pulses intact with no bruits Trace bilateral edema Neuro non-focal Skin warm and dry No muscular weakness   Current Outpatient Prescriptions  Medication Sig Dispense Refill  . allopurinol (ZYLOPRIM) 300 MG tablet Take 300 mg by mouth daily.        [name] amLODipine-olmesartan (AZOR) 5-20 MG per tablet Take 1 tablet by mouth daily.        [name] aspirin 81 MG tablet Take 81 mg by mouth daily.        [name] atenolol (TENORMIN) 25 MG tablet Take 25 mg by mouth daily.        [name] buPROPion (BUPROBAN) 150 MG 12 hr tablet Take 150 mg by mouth 2 (two) times daily.        . clonazePAM (KLONOPIN) 0.5 MG tablet 0.5 mg as needed.        . clopidogrel (PLAVIX) 75 MG tablet Take 75 mg by mouth daily.        . furosemide (LASIX) 20 MG tablet Take 1 tablet (20 mg total) by mouth daily.  30 tablet  11  . HYDROcodone-acetaminophen (LORTAB 5) 5-500 MG per tablet Take 1 tablet by mouth every 6 (six) hours as needed.        . insulin NPH-insulin regular (HUMULIN 70/30) (70-30) 100 UNIT/ML injection As directed      .  isosorbide mononitrate (IMDUR) 30 MG 24 hr tablet Take 1 tablet (30 mg total) by mouth daily.  30 tablet  8  . nitroGLYCERIN (NITROSTAT) 0.4 MG SL tablet Place 0.4 mg under the tongue every 5 (five) minutes as needed.        [name] PARoxetine (PAXIL) 40 MG tablet Take 40 mg by mouth every morning.        . potassium chloride (KLOR-CON 10) 10 MEQ CR tablet Take 1 tablet (10 mEq total) by mouth daily.  30 tablet  11    Allergies  Niaspan and Tolectin  Electrocardiogram:  Assessment and Plan

## 2010-08-28 NOTE — Assessment & Plan Note (Signed)
Reviewed labs from 08/19/10  HbA1c 8.8.  Discussed poor control with patient.  Low carb diet.  F/U primary with target A1c 6.5

## 2010-08-28 NOTE — Assessment & Plan Note (Signed)
Stable F/U duplex or CT in 6 months

## 2010-08-28 NOTE — Assessment & Plan Note (Signed)
Well controlled.  Continue current medications and low sodium Dash type diet.    

## 2010-08-28 NOTE — Patient Instructions (Addendum)
Your physician recommends that you schedule a follow-up appointment in:  Max Anderson has recommended you make the following change in your medication: INCREASE IMDUR TO 60 MG DAILY AND  ATENOLOL 50 MG EVERY DAY

## 2010-08-28 NOTE — Assessment & Plan Note (Signed)
Stable angina with no ischemia on myovue.  I think he was scared that his coroanry "aneurysm" could burst.  I told him this is not like a AAA and usually we worry more about the stenosis.  Aneurysm does prevent stenting though.  Increase nitrates and beta blocker  F/U 3 months.  Refill on sl also called in

## 2010-08-28 NOTE — Assessment & Plan Note (Signed)
Dependant secondary to obesity.  Continue current dose of Lasix

## 2010-09-03 ENCOUNTER — Encounter: Payer: Self-pay | Admitting: Cardiovascular Disease

## 2010-10-12 HISTORY — PX: KNEE SURGERY: SHX244

## 2010-10-16 NOTE — Discharge Summary (Signed)
  NAME:  Max Anderson, Max Anderson NO.:  [id number]  MEDICAL RECORD NO.:  [id number]  LOCATION:  6522                         FACILITY:  [facility]  PHYSICIAN:  Juanda Bond. Burt Knack, MD  DATE OF BIRTH:  1940/08/18  DATE OF ADMISSION:  06/21/2010 DATE OF DISCHARGE:  06/21/2010                              DISCHARGE SUMMARY   FINAL DIAGNOSIS:  Coronary artery disease.  SECONDARY DIAGNOSES: 1. Abdominal aortic aneurysm repair. 2. Left popliteal aneurysm repair. 3. Hypertension.  PROCEDURES PERFORMED:  On Jun 21, 2010, the patient underwent pressure wire analysis of the LAD.  HOSPITAL COURSE:  The patient came in as an outpatient.  He has a history of coronary disease and diagnostic catheterization showed moderate proximal LAD stenosis with an aneurysmal segment of LAD just beyond the area of stenosis.  I reviewed the films with Dr. [name] and I felt that this was an intermediate lesion requiring some functional assessment.  I did not think the patient was a good candidate for stenting because of the aneurysmal segment just beyond the area of stenosis.  The patient underwent pressure wire analysis via a right radial approach.  The procedure was uncomplicated.  The peak FFR was 0.81 which is just below the ischemic threshold.  The patient will continue with medical therapy.  For his coronary artery disease, he does not appear to require revascularization at this point.  The patient was discharged home in stable condition the same day as the procedure.  DISCHARGE CONDITION:  Stable.  FOLLOWUP INSTRUCTIONS:  The patient will follow up with Dr. [name] as scheduled.  DISCHARGE MEDICATIONS:  Please see the medicine reconciliation list. There were no medication changes made.     Juanda Bond. Burt Knack, MD     MDC/MEDQ  D:  09/25/2010  T:  09/26/2010  Job:  [id number]  Electronically Signed by Sherren Mocha MD on 10/16/2010 11:33:31 PM

## 2010-10-22 ENCOUNTER — Other Ambulatory Visit (HOSPITAL_COMMUNITY): Payer: Self-pay | Admitting: Orthopedic Surgery

## 2010-10-22 ENCOUNTER — Ambulatory Visit: Payer: Medicare Other | Admitting: Cardiovascular Disease

## 2010-10-22 ENCOUNTER — Encounter (HOSPITAL_COMMUNITY)
Admission: RE | Admit: 2010-10-22 | Discharge: 2010-10-22 | Disposition: A | Discharge status: Home / Self Care | Payer: Medicare Other | Source: Ambulatory Visit | Attending: Orthopedic Surgery | Admitting: Orthopedic Surgery

## 2010-10-22 ENCOUNTER — Ambulatory Visit (HOSPITAL_COMMUNITY)
Admission: RE | Admit: 2010-10-22 | Discharge: 2010-10-22 | Disposition: A | Discharge status: Home / Self Care | Payer: Medicare Other | Source: Ambulatory Visit | Attending: Orthopedic Surgery | Admitting: Orthopedic Surgery

## 2010-10-22 DIAGNOSIS — Z01818 Encounter for other preprocedural examination: Secondary | ICD-10-CM | POA: Insufficient documentation

## 2010-10-22 DIAGNOSIS — Z01811 Encounter for preprocedural respiratory examination: Secondary | ICD-10-CM

## 2010-10-22 DIAGNOSIS — J8410 Pulmonary fibrosis, unspecified: Secondary | ICD-10-CM | POA: Insufficient documentation

## 2010-10-22 DIAGNOSIS — Z01812 Encounter for preprocedural laboratory examination: Secondary | ICD-10-CM | POA: Insufficient documentation

## 2010-10-22 LAB — CBC
Hemoglobin: 13.5 g/dL (ref 13.0–17.0)
MCHC: 33.7 g/dL (ref 30.0–36.0)
RBC: 4.25 MIL/uL (ref 4.22–5.81)
WBC: 7.4 10*3/uL (ref 4.0–10.5)

## 2010-10-22 LAB — BASIC METABOLIC PANEL
GFR calc Af Amer: 59 mL/min — ABNORMAL LOW (ref >60)
GFR calc non Af Amer: 49 mL/min — ABNORMAL LOW (ref >60)
Glucose, Bld: 286 mg/dL — ABNORMAL HIGH (ref 70–99)
Potassium: 4.8 mEq/L (ref 3.5–5.1)
Sodium: 139 mEq/L (ref 135–145)

## 2010-10-22 LAB — SURGICAL PCR SCREEN: Staphylococcus aureus: NEGATIVE

## 2010-10-25 ENCOUNTER — Ambulatory Visit (HOSPITAL_COMMUNITY)
Admission: RE | Admit: 2010-10-25 | Discharge: 2010-10-25 | Disposition: A | Discharge status: Home / Self Care | Payer: Medicare Other | Source: Ambulatory Visit | Attending: Orthopedic Surgery | Admitting: Orthopedic Surgery

## 2010-10-25 DIAGNOSIS — Z01818 Encounter for other preprocedural examination: Secondary | ICD-10-CM | POA: Insufficient documentation

## 2010-10-25 DIAGNOSIS — M1710 Unilateral primary osteoarthritis, unspecified knee: Secondary | ICD-10-CM | POA: Insufficient documentation

## 2010-10-25 DIAGNOSIS — I2510 Atherosclerotic heart disease of native coronary artery without angina pectoris: Secondary | ICD-10-CM | POA: Insufficient documentation

## 2010-10-25 DIAGNOSIS — I714 Abdominal aortic aneurysm, without rupture, unspecified: Secondary | ICD-10-CM | POA: Insufficient documentation

## 2010-10-25 DIAGNOSIS — M2240 Chondromalacia patellae, unspecified knee: Secondary | ICD-10-CM | POA: Insufficient documentation

## 2010-10-25 DIAGNOSIS — Z79899 Other long term (current) drug therapy: Secondary | ICD-10-CM | POA: Insufficient documentation

## 2010-10-25 DIAGNOSIS — M6750 Plica syndrome, unspecified knee: Secondary | ICD-10-CM | POA: Insufficient documentation

## 2010-10-25 DIAGNOSIS — E119 Type 2 diabetes mellitus without complications: Secondary | ICD-10-CM | POA: Insufficient documentation

## 2010-10-25 DIAGNOSIS — J4489 Other specified chronic obstructive pulmonary disease: Secondary | ICD-10-CM | POA: Insufficient documentation

## 2010-10-25 DIAGNOSIS — J449 Chronic obstructive pulmonary disease, unspecified: Secondary | ICD-10-CM | POA: Insufficient documentation

## 2010-10-25 DIAGNOSIS — E669 Obesity, unspecified: Secondary | ICD-10-CM | POA: Insufficient documentation

## 2010-10-25 DIAGNOSIS — Z87891 Personal history of nicotine dependence: Secondary | ICD-10-CM | POA: Insufficient documentation

## 2010-10-25 DIAGNOSIS — Z01812 Encounter for preprocedural laboratory examination: Secondary | ICD-10-CM | POA: Insufficient documentation

## 2010-10-25 DIAGNOSIS — Z794 Long term (current) use of insulin: Secondary | ICD-10-CM | POA: Insufficient documentation

## 2010-10-25 LAB — GLUCOSE, CAPILLARY: Glucose-Capillary: 182 mg/dL — ABNORMAL HIGH (ref 70–99)

## 2010-10-29 NOTE — Op Note (Signed)
NAME:  HAMZE, AUBREY NO.:  [id number]  MEDICAL RECORD NO.:  [id number]  LOCATION:  [facility]                         FACILITY:  [facility]  PHYSICIAN:  Alta Corning, M.D.   DATE OF BIRTH:  Jun 29, 1940  DATE OF PROCEDURE:  10/25/2010 DATE OF DISCHARGE:  10/25/2010                              OPERATIVE REPORT   PREOPERATIVE DIAGNOSIS:  Lateral meniscal tear.  POSTOPERATIVE DIAGNOSES: 1. Lateral meniscal tear. 2. Chondromalacia of medial femoral condyle. 3. Chondromalacia of patellofemoral joint. 4. Medial shelf plica.  PROCEDURE: 1. Left knee arthroscopy with debridement of lateral meniscal tear and     corresponding debridement of lateral compartment. 2. Debridement of chondromalacia of medial femoral condyle down to     bleeding bone. 3. Debridement of chondromalacia of patellofemoral joint down to     bleeding bone. 4. Excision of large, thick tenacious fibrous medial plica.  SURGEON:  Alta Corning, M.D.  ASSISTANT:  Gary Fleet, PA  ANESTHESIA:  General.  BRIEF HISTORY:  Mr. Dorey is a 70 year old male with long history of significant left knee pain.  We treated him conservatively for a long period of time with activity modification, injection therapy, and use of cane.  Because of failure of all conservative care, x-rays were taken which did not show bone-on-bone changes, but did show a little bit of the narrowing.  He had a large significant lateral joint, ultimately felt that operative knee arthroscopy was the most proper course of action.  He was cleared by Cardiology and went felt to be a suitable operative candidate.  He was taken to the operating room for left knee arthroscopy.  PROCEDURE:  The patient was taken to the operating room.  After adequate anesthesia was obtained with general anesthetic, the patient was placed supine on the operating room.  The left leg was prepped and draped in the usual sterile fashion.  Following this,  routine arthroscopic examination of the left knee revealed there was an obvious lateral meniscal tear from the anterior horn down around the midbody and posterior.  This was debrided all the way around this area.  There was grade 2 and grade 3 change in the lateral femoral condyle, which was debrided.  Attention turned to the ACL, normal.  Attention turned to the medial side which was blocked by a large fibrous thick medial plica which was debrided.  Once we gained access into the medial compartment, there was noted to be a significant chondromalacia of the medial femoral condyle, grade 3 and grade 4 in some places.  This was debrided back to a smooth and stable bone.  Attention turned to patellofemoral joint where a thorough debridement undertaken and there was significant grade 2 and grade 3 chondromalacia.  Following this, the knee was copious and thoroughly lavaged with 6 liters of normal saline, irrigation, suctioned, dry.  Lateral portal was bandaged.  Sterile compressive dressing was applied and the patient taken to recovery room, was noted to be in satisfactory condition.  Estimated blood loss for this procedure was minimal.     Alta Corning, M.D.     Corliss Skains  D:  10/25/2010  T:  10/25/2010  Job:  [id number]  Electronically Signed by Dorna Leitz M.D. on 10/29/2010 12:48:00 PM

## 2010-11-05 NOTE — H&P (Signed)
NAME:  Max Anderson, Max Anderson NO.:  [id number]  MEDICAL RECORD NO.:  [id number]  LOCATION:  [place]                         FACILITY:  [facility]  PHYSICIAN:  Alta Corning, M.D.   DATE OF BIRTH:  19-Feb-1940  DATE OF ADMISSION:  10/25/2010 DATE OF DISCHARGE:  10/25/2010                             HISTORY & PHYSICAL   CHIEF COMPLAINT:  Left lateral knee pain, catching, grabbing, and swelling.  BRIEF HISTORY:  Max Anderson is a 70 year old male who has a long history of left lateral knee pain, sharp pain when he twists, pain when he walks, and a catching sensation.  He was treated in the office with injection therapy with cortisone and oral medication and modification of his activities.  X-rays showed some lateral compartment narrowing but bone-on-bone.  He was felt based on his clinical findings to have a lateral meniscal tear of the left knee.  It did not improve with exhaustive conservative treatment.  He was brought to the hospital for left knee arthroscopy which can be done on an outpatient basis.  ALLERGIES:  NIACIN and TOLECTIN.  CURRENT MEDICATIONS: 1. Nitroglycerin sublingual 0.4 mg under his tongue as needed for     chest pain. 2. Imdur 60 mg 1 daily. 3. Azor 5/20 mg 1 daily. 4. Potassium chloride 10 mEq 1 daily. 5. Paxil 40 mg 1 daily. 6. Humulin 70/30 insulin, 100 units subcu b.i.d. 7. Atenolol 50 mg daily. 8. Enteric-coated aspirin 81 mg daily. 9. Lortab 10 mg/500 mg 4 times daily as needed for pain. 10.Lasix 20 mg 1 daily. 11.Plavix 75 mg 1 daily. 12.Clonazepam 5 mg b.i.d. 13.Wellbutrin XL 150 mg b.i.d. 14.Allopurinol 300 mg daily.  PAST MEDICAL HISTORY:  Positive for coronary artery disease, hypertension, chronic pain, gouty arthritis, depression and anxiety and diabetes mellitus.  Positive for sleep apnea.  He has had pancreatitis in the past as well.  PAST SURGICAL HISTORY:  Cholecystectomy, appendectomy, AAA repair in 2008, history of prostate  removal in 1998, urinary prosthesis in 1999, lumbar fusion, right fem-pop bypass 2010, multiple cardiac stents and 6 surgeries on right leg for a fractured femur and tibia back in the early 1980s.  SOCIAL HISTORY:  Has a past history of smoking x33 years.  He quit recently.  He does not drink alcohol or use recreational drugs.  REVIEW OF SYSTEMS:  Positive for chest pain on exertion and shortness of breath on exertion.  He had some reflux symptoms.  Otherwise negative related to the HPI.  PHYSICAL EXAMINATION:  VITAL SIGNS:  His temperature was 97.1, pulse 63, respirations 20, blood pressure 128/75. GENERAL:  This is a well-developed, well-nourished patient who is obese. He ambulates with a limp secondary to left knee pain. HEAD:  Normocephalic, atraumatic. EYES:  Pupils are equal and reactive to light and accommodation.  EOMs intact. HEART:  Regular rate and rhythm without murmurs. LUNGS:  Clear to auscultation in all lung fields. ABDOMEN:  Obese.  Positive bowel sounds. EXTREMITIES:  Left knee exam shows lateral joint line tenderness.  He has a +1 effusion.  His range of motion is 0 degrees to 120 degrees.  He has positive McMurray's with stress to the lateral compartment.  No laxity to varus or valgus stress.  Lachman was negative.  ASSESSMENT:  Left knee pain secondary to lateral meniscal tear, left knee with degenerative arthritis.  PLAN:  The patient is brought to the hospital for a left knee arthroscopy done by Dorna Leitz, MD.     Gary Fleet, P.A.   ______________________________ Alta Corning, M.D.    JB/MEDQ  D:  10/25/2010  T:  10/25/2010  Job:  [id number]  Electronically Signed by Gary Fleet P.A. on 10/30/2010 01:04:23 PM Electronically Signed by Dorna Leitz M.D. on 11/05/2010 05:21:56 PM

## 2010-12-04 ENCOUNTER — Ambulatory Visit: Payer: Medicare Other | Admitting: Cardiovascular Disease

## 2011-01-01 ENCOUNTER — Encounter: Payer: Self-pay | Admitting: Cardiovascular Disease

## 2011-01-01 ENCOUNTER — Ambulatory Visit (INDEPENDENT_AMBULATORY_CARE_PROVIDER_SITE_OTHER): Payer: Medicare Other | Admitting: Cardiovascular Disease

## 2011-01-01 DIAGNOSIS — I10 Essential (primary) hypertension: Secondary | ICD-10-CM

## 2011-01-01 DIAGNOSIS — I739 Peripheral vascular disease, unspecified: Secondary | ICD-10-CM

## 2011-01-01 DIAGNOSIS — I2510 Atherosclerotic heart disease of native coronary artery without angina pectoris: Secondary | ICD-10-CM

## 2011-01-01 DIAGNOSIS — Z8679 Personal history of other diseases of the circulatory system: Secondary | ICD-10-CM

## 2011-01-01 NOTE — Assessment & Plan Note (Signed)
F/U [facility] for ct/duplex.  No abdominal pain.  Activity limited by arthritis and obesity more than PVD

## 2011-01-01 NOTE — Progress Notes (Signed)
Max Anderson is seen today for F/U of CAD and PVC He has had a AAA repair and left popliteal aneurysm reapair. [name] He has CAD with previous stenting of the circ in [place]/[facility] ? 2006  He has never been cathed here in [place]. He is very sedentary and only walks from "den to [employer] He had a normal myovue here in 12/2008 He continues to have exertional chest pain. Taking nitro fairly regularly. H2 blocker has not helped as much as we thought it should. Given known CAD with "angina" at low work load cath was done first week in May. He had a significant promimal LAD lesion with proximal aneurysm 3 days latter I had Dr Burt Knack evaluate with flow wire and he did not meed criteria for intervention. F/U myovue 6/20 read by Dr Ron Parker as normal. To my eye small inferior infarct but no ischemia especially in the anterior territory Has seen Dr Early for vascular F/U Reviewed notes. CT 5/29 with residula 3.4 cm aneurysm sac and no endoleak Does not want to F/U at [facility]. Continued pain and LE edema in right leg were popliteal aneurysm was. Needs diuretic for edema related to surgery and obesity.  ROS: Denies fever, malais, weight loss, blurry vision, decreased visual acuity, cough, sputum, SOB, hemoptysis, pleuritic pain, palpitaitons, heartburn, abdominal pain, melena, lower extremity edema, claudication, or rash.  All other systems reviewed and negative  General: Affect appropriate Healthy:  appears stated age 70: normal Neck supple with no adenopathy JVP normal no bruits no thyromegaly Lungs clear with no wheezing and good diaphragmatic motion Heart:  S1/S2 no murmur,rub, gallop or click PMI normal Abdomen: benighn, BS positve, no tenderness, no AAA no bruit.  No HSM or HJR Distal pulses intact with no bruits No edema Neuro non-focal Skin warm and dry No muscular weakness   Current Outpatient Prescriptions  Medication Sig Dispense Refill  . allopurinol (ZYLOPRIM) 300 MG tablet Take 300 mg by mouth  daily.        [name] amLODipine-olmesartan (AZOR) 5-20 MG per tablet Take 1 tablet by mouth daily.        [name] aspirin 81 MG tablet Take 81 mg by mouth daily.        [name] atenolol (TENORMIN) 50 MG tablet Take 1 tablet (50 mg total) by mouth daily.  30 tablet  11  . buPROPion (BUPROBAN) 150 MG 12 hr tablet Take 150 mg by mouth 2 (two) times daily.        . clonazePAM (KLONOPIN) 0.5 MG tablet 0.5 mg as needed.        . clopidogrel (PLAVIX) 75 MG tablet Take 75 mg by mouth daily.        . furosemide (LASIX) 20 MG tablet Take 1 tablet (20 mg total) by mouth daily.  30 tablet  11  . HYDROcodone-acetaminophen (LORTAB 5) 5-500 MG per tablet Take 1 tablet by mouth every 6 (six) hours as needed.        . insulin NPH-insulin regular (HUMULIN 70/30) (70-30) 100 UNIT/ML injection As directed      . isosorbide mononitrate (IMDUR) 60 MG 24 hr tablet Take 1 tablet (60 mg total) by mouth daily.  30 tablet  11  . nitroGLYCERIN (NITROSTAT) 0.4 MG SL tablet Place 1 tablet (0.4 mg total) under the tongue every 5 (five) minutes as needed.  90 tablet  1  . PARoxetine (PAXIL) 40 MG tablet Take 40 mg by mouth every morning.        . potassium chloride (  KLOR-CON 10) 10 MEQ CR tablet Take 1 tablet (10 mEq total) by mouth daily.  30 tablet  11    Allergies  Niaspan and Tolectin  Electrocardiogram:  Assessment and Plan

## 2011-01-01 NOTE — Assessment & Plan Note (Signed)
Well controlled.  Continue current medications and low sodium Dash type diet.    

## 2011-01-01 NOTE — Assessment & Plan Note (Signed)
Check P2y platlet inhibitor test.  Continue ASA.  No angina.  F/U 6 months

## 2011-01-01 NOTE — Patient Instructions (Addendum)
Your physician wants you to follow-up in: 6 months.   You will receive a reminder letter in the mail two months in advance. If you don't receive a letter, please call our office to schedule the follow-up appointment.  Your physician recommends that you return for lab work in: today  ([place])

## 2011-06-18 ENCOUNTER — Other Ambulatory Visit: Payer: Self-pay | Admitting: *Deleted

## 2011-06-18 DIAGNOSIS — Z48812 Encounter for surgical aftercare following surgery on the circulatory system: Secondary | ICD-10-CM

## 2011-06-18 DIAGNOSIS — I714 Abdominal aortic aneurysm, without rupture: Secondary | ICD-10-CM

## 2011-06-24 ENCOUNTER — Other Ambulatory Visit: Payer: Self-pay | Admitting: *Deleted

## 2011-06-24 ENCOUNTER — Other Ambulatory Visit: Payer: Self-pay | Admitting: Cardiovascular Disease

## 2011-06-24 DIAGNOSIS — R609 Edema, unspecified: Secondary | ICD-10-CM

## 2011-06-24 DIAGNOSIS — M79606 Pain in leg, unspecified: Secondary | ICD-10-CM

## 2011-06-24 MED ORDER — FUROSEMIDE 20 MG PO TABS: 20.0000 mg | ORAL_TABLET | Freq: Every day | ORAL | Status: DC

## 2011-07-08 ENCOUNTER — Ambulatory Visit (INDEPENDENT_AMBULATORY_CARE_PROVIDER_SITE_OTHER): Payer: Medicare Other | Admitting: Cardiovascular Disease

## 2011-07-08 ENCOUNTER — Other Ambulatory Visit: Payer: Self-pay

## 2011-07-08 ENCOUNTER — Encounter: Payer: Self-pay | Admitting: Cardiovascular Disease

## 2011-07-08 VITALS — BP 155/77 | HR 77 | Ht 70.0 in | Wt 290.4 lb

## 2011-07-08 DIAGNOSIS — I2510 Atherosclerotic heart disease of native coronary artery without angina pectoris: Secondary | ICD-10-CM

## 2011-07-08 DIAGNOSIS — I724 Aneurysm of artery of lower extremity: Secondary | ICD-10-CM

## 2011-07-08 DIAGNOSIS — E119 Type 2 diabetes mellitus without complications: Secondary | ICD-10-CM

## 2011-07-08 DIAGNOSIS — I10 Essential (primary) hypertension: Secondary | ICD-10-CM

## 2011-07-08 DIAGNOSIS — Z48812 Encounter for surgical aftercare following surgery on the circulatory system: Secondary | ICD-10-CM

## 2011-07-08 DIAGNOSIS — I739 Peripheral vascular disease, unspecified: Secondary | ICD-10-CM

## 2011-07-08 DIAGNOSIS — R609 Edema, unspecified: Secondary | ICD-10-CM

## 2011-07-08 NOTE — Assessment & Plan Note (Signed)
Well controlled.  Continue current medications and low sodium Dash type diet.    

## 2011-07-08 NOTE — Assessment & Plan Note (Signed)
Dependant from obesity continue current diuretic

## 2011-07-08 NOTE — Assessment & Plan Note (Signed)
Discussed low carb diet.  Target hemoglobin A1c is 6.5 or less.  Continue current medications.  

## 2011-07-08 NOTE — Assessment & Plan Note (Signed)
Personally spoke with Dr Donnetta Hutching will F/U with [facility].  No claudication but has endoleak

## 2011-07-08 NOTE — Assessment & Plan Note (Signed)
High risk LAD lesion and aneurysm  F/U myoveu as dyspnea may represent anginal equivalent.  Continue meds Has nitro

## 2011-07-08 NOTE — Progress Notes (Signed)
Patient ID: Max Anderson, male   DOB: Jun 20, 1940, 71 y.o.   MRN: [id number] He has never been cathed here in [place]. He is very sedentary and only walks from "den to [employer] He had a normal myovue here in 12/2008 He continues to have exertional chest pain. Taking nitro fairly regularly. H2 blocker has not helped as much as we thought it should. Given known CAD with "angina" at low work load cath was done first week in May. He had a significant promimal LAD lesion with proximal aneurysm 3 days latter I had Dr Burt Knack evaluate with flow wire and he did not meed criteria for intervention. F/U myovue 07/31/10 read by Dr Ron Parker as normal. To my eye small inferior infarct but no ischemia especially in the anterior territory Has seen Dr Early for vascular F/U Reviewed notes. CT 07/09/10  with residual  3.4 cm aneurysm sac and no endoleak Does not want to F/U at [facility]. Continued pain and LE edema in right leg were popliteal aneurysm was. Needs diuretic for edema related to surgery and obesity. Continues to have exertional dyspnea.  Has not had to take nitro.  Needs F/U with [facility] as it has been over a year  ROS: Denies fever, malais, weight loss, blurry vision, decreased visual acuity, cough, sputum,hemoptysis, pleuritic pain, palpitaitons, heartburn, abdominal pain, melena, lower extremity edema, claudication, or rash.  All other systems reviewed and negative  General: Affect appropriate Chronically ill obese male HEENT: normal Neck supple with no adenopathy JVP normal no bruits no thyromegaly Lungs clear with no wheezing and good diaphragmatic motion Heart:  S1/S2 no murmur, no rub, gallop or click PMI normal Abdomen: benighn, BS positve, no tenderness, no AAA ventral hernia no bruit.  No HSM or HJR Distal pulses intact with no bruits Plus one bilateral edema Neuro non-focal Skin warm and dry No muscular weakness   Current Outpatient Prescriptions  Medication Sig Dispense Refill  . allopurinol  (ZYLOPRIM) 300 MG tablet Take 300 mg by mouth daily.        [name] amLODipine-olmesartan (AZOR) 5-20 MG per tablet Take 1 tablet by mouth daily.        [name] aspirin 81 MG tablet Take 81 mg by mouth daily.        [name] atenolol (TENORMIN) 50 MG tablet Take 1 tablet (50 mg total) by mouth daily.  30 tablet  11  . buPROPion (BUPROBAN) 150 MG 12 hr tablet Take 150 mg by mouth 2 (two) times daily.        . clonazePAM (KLONOPIN) 0.5 MG tablet 0.5 mg as needed.        . clopidogrel (PLAVIX) 75 MG tablet Take 75 mg by mouth daily.        . furosemide (LASIX) 20 MG tablet Take 1 tablet (20 mg total) by mouth daily.  30 tablet  6  . HYDROcodone-acetaminophen (LORTAB 5) 5-500 MG per tablet Take 1 tablet by mouth every 6 (six) hours as needed.        . insulin NPH-insulin regular (HUMULIN 70/30) (70-30) 100 UNIT/ML injection As directed      . isosorbide mononitrate (IMDUR) 60 MG 24 hr tablet Take 1 tablet (60 mg total) by mouth daily.  30 tablet  11  . KLOR-CON 10 10 MEQ tablet TAKE ONE TABLET BY MOUTH ONCE DAILY  30 each  6  . nitroGLYCERIN (NITROSTAT) 0.4 MG SL tablet Place 1 tablet (0.4 mg total) under the tongue every 5 (five) minutes as needed.  90 tablet  1  . PARoxetine (PAXIL) 40 MG tablet Take 40 mg by mouth every morning.          Allergies  Niaspan and Tolectin  Electrocardiogram:  NSR LAD Septal q's no change from 2012  Assessment and Plan

## 2011-07-08 NOTE — Patient Instructions (Addendum)
Your physician has requested that you have a lexiscan myoview. For further information please visit [email or web address]. Please follow instruction sheet, as given.  Your physician wants you to follow-up in: 1 year.   You will receive a reminder letter in the mail two months in advance. If you don't receive a letter, please call our office to schedule the follow-up appointment.  Your physician recommends that you schedule a follow-up appointment in: as soon as possible with Dr Donnetta Hutching

## 2011-07-14 ENCOUNTER — Ambulatory Visit (HOSPITAL_COMMUNITY): Payer: Medicare Other | Attending: Cardiology | Admitting: Radiology

## 2011-07-14 VITALS — BP 95/62 | Ht 70.0 in | Wt 288.0 lb

## 2011-07-14 DIAGNOSIS — R5381 Other malaise: Secondary | ICD-10-CM | POA: Insufficient documentation

## 2011-07-14 DIAGNOSIS — I739 Peripheral vascular disease, unspecified: Secondary | ICD-10-CM | POA: Insufficient documentation

## 2011-07-14 DIAGNOSIS — Z87891 Personal history of nicotine dependence: Secondary | ICD-10-CM | POA: Insufficient documentation

## 2011-07-14 DIAGNOSIS — R079 Chest pain, unspecified: Secondary | ICD-10-CM | POA: Insufficient documentation

## 2011-07-14 DIAGNOSIS — E119 Type 2 diabetes mellitus without complications: Secondary | ICD-10-CM | POA: Insufficient documentation

## 2011-07-14 DIAGNOSIS — I10 Essential (primary) hypertension: Secondary | ICD-10-CM | POA: Insufficient documentation

## 2011-07-14 DIAGNOSIS — Z794 Long term (current) use of insulin: Secondary | ICD-10-CM | POA: Insufficient documentation

## 2011-07-14 DIAGNOSIS — R0989 Other specified symptoms and signs involving the circulatory and respiratory systems: Secondary | ICD-10-CM | POA: Insufficient documentation

## 2011-07-14 DIAGNOSIS — E785 Hyperlipidemia, unspecified: Secondary | ICD-10-CM | POA: Insufficient documentation

## 2011-07-14 DIAGNOSIS — I2510 Atherosclerotic heart disease of native coronary artery without angina pectoris: Secondary | ICD-10-CM | POA: Insufficient documentation

## 2011-07-14 DIAGNOSIS — Z9861 Coronary angioplasty status: Secondary | ICD-10-CM | POA: Insufficient documentation

## 2011-07-14 DIAGNOSIS — R5383 Other fatigue: Secondary | ICD-10-CM | POA: Insufficient documentation

## 2011-07-14 DIAGNOSIS — R0609 Other forms of dyspnea: Secondary | ICD-10-CM | POA: Insufficient documentation

## 2011-07-14 MED ORDER — REGADENOSON 0.4 MG/5ML IV SOLN
0.4000 mg | Freq: Once | INTRAVENOUS | Status: AC
  Administered 2011-07-14: 0.4 mg via INTRAVENOUS

## 2011-07-14 MED ORDER — TECHNETIUM TC 99M TETROFOSMIN IV KIT
10.0000 | PACK | Freq: Once | INTRAVENOUS | Status: AC | PRN
  Administered 2011-07-14: 10 via INTRAVENOUS

## 2011-07-14 MED ORDER — TECHNETIUM TC 99M TETROFOSMIN IV KIT
30.0000 | PACK | Freq: Once | INTRAVENOUS | Status: AC | PRN
  Administered 2011-07-14: 30 via INTRAVENOUS

## 2011-07-14 NOTE — Progress Notes (Signed)
[place] [id number]  Cardiology Nuclear Med Study  Max Anderson is a 71 y.o. male     MRN : [id number]     DOB: 08/15/1940  Procedure Date: 07/14/2011  Nuclear Med Background Indication for Stress Test:  Evaluation for Ischemia History:  '06 PTCA/Stent of CFX,LAD and RCA @ [facility],05/12 Heart Catheterization:EF=65%,no CAD of RCA,CFX stent patent,Left LAD stent 40-50%,moderate stenosis of prox LAD and large aneursym segment of LAD and 6/12 MPS: EF=66% ,no ischemia and small inferior infarct. Cardiac Risk Factors: History of Smoking, Hypertension, IDDM Type 2, Lipids and PVD  Symptoms:  Chest Pain with Exertion (last date of chest discomfort last night with 6/10), DOE and Fatigue   Nuclear Pre-Procedure Caffeine/Decaff Intake:  None NPO After: 8:00am   Lungs:  clear O2 Sat: 94% on RA IV 0.9% NS with Angio Cath:  22g  IV Site: R Forearm  IV Started by:  Eliezer Lofts, EMT-P  Chest Size (in):  54 Cup Size: n/a  Height: 5\' 10"  (1.778 m)  Weight:  288 lb (130.636 kg)  BMI:  Body mass index is 41.32 kg/(m^2). Tech Comments:  CBG= 138@ 12:25pm.    Nuclear Med Study 1 or 2 day study: 1 day  Stress Test Type:  Lexiscan  Reading MD: Dola Argyle, MD  Order Authorizing Provider:  Verlin Dike  Resting Radionuclide: Technetium 11m Tetrofosmin  Resting Radionuclide Dose: 11.0 mCi   Stress Radionuclide:  Technetium 61m Tetrofosmin  Stress Radionuclide Dose: 33.0 mCi           Stress Protocol Rest HR: 61 Stress HR: 69  Rest BP: 95/62 Stress BP: 116/60  Exercise Time (min): n/a METS: n/a   Predicted Max HR: 150 bpm % Max HR: 44.67 bpm Rate Pressure Product: 7772   Dose of Adenosine (mg):  n/a Dose of Lexiscan: 0.4 mg  Dose of Atropine (mg): n/a Dose of Dobutamine: n/a mcg/kg/min (at max HR)  Stress Test Technologist: Matilde Haymaker, RN  Nuclear Technologist:  Charlton Amor, CNMT     Rest Procedure:   Myocardial perfusion imaging was performed at rest 45 minutes following the intravenous administration of Technetium 42m Tetrofosmin. Rest ECG: NSR - Normal EKG  Stress Procedure:  The patient received IV Lexiscan 0.4 mg over 15-seconds.  Technetium 71m Tetrofosmin injected at 30-seconds.  There were no significant changes with Lexiscan.  Quantitative spect images were obtained after a 45 minute delay. Stress ECG: No significant change from baseline ECG  QPS Raw Data Images:  Patient motion noted; appropriate software correction applied. Stress Images:  There is a lumpy bumpy appearance to the images. However there are no diagnostic abnormalities. Rest Images:  The images are similar to stress Subtraction (SDS):  No evidence of ischemia. Transient Ischemic Dilatation (Normal <1.22):  1.07 Lung/Heart Ratio (Normal <0.45): 0.46   Quantitative Gated Spect Images QGS EDV:  98 ml QGS ESV:  38 ml  Impression Exercise Capacity:  Lexiscan with no exercise. BP Response:  Normal blood pressure response. Clinical Symptoms:  shortness of breath ECG Impression:  No significant ST segment change suggestive of ischemia. Comparison with Prior Nuclear Study: No images to compare  Overall Impression:  Normal stress nuclear study. Overall there are no diagnostic abnormalities. There is no definite scar or ischemia. There is normal wall motion.  LV Ejection Fraction: 61%.  LV Wall Motion:  Normal Wall Motion  Dola Argyle, MD

## 2011-07-21 ENCOUNTER — Encounter: Payer: Self-pay | Admitting: Vascular Surgery

## 2011-07-21 ENCOUNTER — Other Ambulatory Visit: Payer: Self-pay | Admitting: Cardiovascular Disease

## 2011-07-22 ENCOUNTER — Encounter: Payer: Self-pay | Admitting: Vascular Surgery

## 2011-07-22 ENCOUNTER — Encounter (INDEPENDENT_AMBULATORY_CARE_PROVIDER_SITE_OTHER): Payer: Medicare Other | Admitting: *Deleted

## 2011-07-22 ENCOUNTER — Ambulatory Visit: Payer: Medicare Other | Admitting: Vascular Surgery

## 2011-07-22 ENCOUNTER — Ambulatory Visit (INDEPENDENT_AMBULATORY_CARE_PROVIDER_SITE_OTHER): Payer: Medicare Other | Admitting: Vascular Surgery

## 2011-07-22 VITALS — BP 134/77 | HR 61 | Resp 18 | Ht 70.0 in | Wt 286.8 lb

## 2011-07-22 DIAGNOSIS — I714 Abdominal aortic aneurysm, without rupture, unspecified: Secondary | ICD-10-CM | POA: Insufficient documentation

## 2011-07-22 DIAGNOSIS — I724 Aneurysm of artery of lower extremity: Secondary | ICD-10-CM

## 2011-07-22 DIAGNOSIS — Z48812 Encounter for surgical aftercare following surgery on the circulatory system: Secondary | ICD-10-CM

## 2011-07-22 DIAGNOSIS — I739 Peripheral vascular disease, unspecified: Secondary | ICD-10-CM | POA: Insufficient documentation
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
## 2011-07-23 ENCOUNTER — Other Ambulatory Visit: Payer: Self-pay | Admitting: *Deleted

## 2011-07-23 DIAGNOSIS — M79606 Pain in leg, unspecified: Secondary | ICD-10-CM

## 2011-07-23 DIAGNOSIS — R609 Edema, unspecified: Secondary | ICD-10-CM

## 2011-07-23 MED ORDER — FUROSEMIDE 20 MG PO TABS: 20.0000 mg | ORAL_TABLET | Freq: Every day | ORAL | Status: DC

## 2011-07-23 NOTE — Telephone Encounter (Signed)
Fax Received. Refill Completed. Max Anderson (R.M.A)   

## 2011-07-31 NOTE — Procedures (Unsigned)
BYPASS GRAFT EVALUATION  INDICATION:  Followup right popliteal aneurysm and right femoral to popliteal bypass graft.  HISTORY: Diabetes:  Yes Cardiac: Hypertension:  Yes Smoking:  Previous Previous Surgery:  AAA repair in 2008, right femoral to popliteal bypass graft in 2010  SINGLE LEVEL ARTERIAL EXAM                              RIGHT              LEFT Brachial: Anterior tibial: Posterior tibial: Peroneal: Ankle/brachial index:        1.07               1.05  PREVIOUS ABI:  Date:  06/25/2010  RIGHT:  1.09  LEFT:  1.14  LOWER EXTREMITY BYPASS GRAFT DUPLEX EXAM:  DUPLEX:  No hemodynamically significant stenosis or plaque identified in the right lower extremity arterial system. Previous popliteal aneurysm appears occluded and measures 1.12 x 1.11 cm. Mild dilatation noted at the distal anastomosis of the right femoral to popliteal bypass graft measuring 1.18 cm.  IMPRESSION: 1. No evidence of stenosis, as noted above. 2. Dilatation present, as noted above. 3. Bilateral ankle brachial indices are within normal limits.  ___________________________________________ Rosetta Posner, M.D.  EM/MEDQ  D:  07/22/2011  T:  07/22/2011  Job:  [id number]

## 2011-09-03 ENCOUNTER — Other Ambulatory Visit: Payer: Self-pay | Admitting: *Deleted

## 2011-09-03 DIAGNOSIS — I2510 Atherosclerotic heart disease of native coronary artery without angina pectoris: Secondary | ICD-10-CM

## 2011-09-03 MED ORDER — ISOSORBIDE MONONITRATE ER 60 MG PO TB24: 60.0000 mg | ORAL_TABLET | Freq: Every day | ORAL | Status: DC

## 2011-09-19 ENCOUNTER — Other Ambulatory Visit: Payer: Self-pay | Admitting: Cardiovascular Disease

## 2012-07-19 ENCOUNTER — Other Ambulatory Visit: Payer: Self-pay | Admitting: *Deleted

## 2012-07-19 ENCOUNTER — Ambulatory Visit (HOSPITAL_COMMUNITY)
Admission: RE | Admit: 2012-07-19 | Discharge: 2012-07-19 | Disposition: A | Discharge status: Home / Self Care | Payer: Medicare Other | Source: Ambulatory Visit | Attending: Vascular Surgery | Admitting: Vascular Surgery

## 2012-07-19 DIAGNOSIS — I739 Peripheral vascular disease, unspecified: Secondary | ICD-10-CM

## 2012-07-19 DIAGNOSIS — Z48812 Encounter for surgical aftercare following surgery on the circulatory system: Secondary | ICD-10-CM

## 2012-07-19 DIAGNOSIS — I714 Abdominal aortic aneurysm, without rupture, unspecified: Secondary | ICD-10-CM | POA: Insufficient documentation

## 2012-07-19 DIAGNOSIS — I723 Aneurysm of iliac artery: Secondary | ICD-10-CM | POA: Insufficient documentation

## 2012-07-19 DIAGNOSIS — Z8546 Personal history of malignant neoplasm of prostate: Secondary | ICD-10-CM | POA: Insufficient documentation

## 2012-07-19 LAB — POCT I-STAT, CHEM 8
BUN: 21 mg/dL (ref 6–23)
Chloride: 106 mEq/L (ref 96–112)
Creatinine, Ser: 1.1 mg/dL (ref 0.50–1.35)
Potassium: 4.8 mEq/L (ref 3.5–5.1)
Sodium: 135 mEq/L (ref 135–145)
TCO2: 26 mmol/L (ref 0–100)

## 2012-07-19 MED ORDER — IOHEXOL 350 MG/ML SOLN
100.0000 mL | Freq: Once | INTRAVENOUS | Status: AC | PRN
  Administered 2012-07-19: 100 mL via INTRAVENOUS

## 2012-07-19 NOTE — Progress Notes (Signed)
Blood sample obtained from left arm IV for Creatnine level.  

## 2012-07-26 ENCOUNTER — Encounter: Payer: Self-pay | Admitting: Vascular Surgery

## 2012-07-27 ENCOUNTER — Ambulatory Visit (INDEPENDENT_AMBULATORY_CARE_PROVIDER_SITE_OTHER): Payer: Medicare Other | Admitting: Vascular Surgery

## 2012-07-27 ENCOUNTER — Encounter (INDEPENDENT_AMBULATORY_CARE_PROVIDER_SITE_OTHER): Payer: Medicare Other | Admitting: *Deleted

## 2012-07-27 ENCOUNTER — Encounter: Payer: Self-pay | Admitting: Vascular Surgery

## 2012-07-27 VITALS — BP 146/65 | HR 70 | Ht 70.0 in | Wt 292.6 lb

## 2012-07-27 DIAGNOSIS — I714 Abdominal aortic aneurysm, without rupture: Secondary | ICD-10-CM

## 2012-07-27 DIAGNOSIS — Z48812 Encounter for surgical aftercare following surgery on the circulatory system: Secondary | ICD-10-CM

## 2012-07-27 DIAGNOSIS — I739 Peripheral vascular disease, unspecified: Secondary | ICD-10-CM

## 2012-07-27 NOTE — Progress Notes (Signed)
The patient presents today for followup of his stent graft repair of abdominal aortic aneurysm and also right above-knee to below-knee popliteal bypass with vein for popliteal artery aneurysm. All the surgeries were done at [facility]. He continues to have significant limitation due to the severe shortness of breath with exertion and also do to weakness in his lower extremities most likely related to degenerative disc disease in his back. He has no symptoms of arterial insufficiency.  Past Medical History  Diagnosis Date  . CAD (coronary artery disease)     multiple stents to the circ and right coronary artery. 2007 [facility]  . HTN (hypertension)   . AAA (abdominal aortic aneurysm)   . Gout   . Diabetes mellitus   . Cancer     History  Substance Use Topics  . Smoking status: Former Smoker    Quit date: 02/11/1987  . Smokeless tobacco: Not on file  . Alcohol Use: No    Family History  Problem Relation Age of Onset  . Heart attack Mother     Allergies  Allergen Reactions  . Niaspan (Niacin)   . Tolectin (Tolmetin Sodium)     Current outpatient prescriptions:allopurinol (ZYLOPRIM) 300 MG tablet, Take 300 mg by mouth daily.  , Disp: , Rfl: ;  amLODipine-olmesartan (AZOR) 5-20 MG per tablet, Take 1 tablet by mouth daily.  , Disp: , Rfl: ;  aspirin 81 MG tablet, Take 81 mg by mouth daily.  , Disp: , Rfl: ;  buPROPion (BUPROBAN) 150 MG 12 hr tablet, Take 150 mg by mouth 2 (two) times daily.  , Disp: , Rfl:  clonazePAM (KLONOPIN) 0.5 MG tablet, 0.5 mg daily. Takes 1 tablet daily., Disp: , Rfl: ;  clopidogrel (PLAVIX) 75 MG tablet, Take 75 mg by mouth daily.  , Disp: , Rfl: ;  HYDROcodone-acetaminophen (NORCO) 10-325 MG per tablet, Take 1 tablet by mouth every 6 (six) hours as needed for pain., Disp: , Rfl: ;  insulin NPH-insulin regular (HUMULIN 70/30) (70-30) 100 UNIT/ML injection, As directed, Disp: , Rfl:  isosorbide mononitrate (IMDUR) 60 MG 24 hr tablet, Take 1 tablet (60 mg total) by  mouth daily., Disp: 30 tablet, Rfl: 11;  KLOR-CON 10 10 MEQ tablet, TAKE ONE TABLET BY MOUTH ONCE DAILY, Disp: 30 each, Rfl: 10;  nitroGLYCERIN (NITROSTAT) 0.4 MG SL tablet, Place 1 tablet (0.4 mg total) under the tongue every 5 (five) minutes as needed., Disp: 90 tablet, Rfl: 1;  PARoxetine (PAXIL) 40 MG tablet, Take 40 mg by mouth every morning.  , Disp: , Rfl:  TENORMIN 50 MG tablet, TAKE ONE TABLET BY MOUTH ONCE DAILY, Disp: 30 each, Rfl: 12;  furosemide (LASIX) 20 MG tablet, Take 1 tablet (20 mg total) by mouth daily., Disp: 90 tablet, Rfl: 3;  HYDROcodone-acetaminophen (LORTAB 5) 5-500 MG per tablet, Take 1 tablet by mouth every 6 (six) hours as needed. , Disp: , Rfl:   BP 146/65  Pulse 70  Ht 5\' 10"  (1.778 m)  Wt 292 lb 9.6 oz (132.722 kg)  BMI 41.98 kg/m2  SpO2 97%  Body mass index is 41.98 kg/(m^2).       Physical exam well-developed obese gentleman in no acute distress with some shortness of breath with exertion in the exam room Abdomen nontender obese no masses noted Palpable popliteal pulses bilaterally Neurologically he is grossly intact Well-healed above-knee to below-knee incision for his right popliteal bypass  CT scan from last week was reviewed and this shows good graft positioning with  no evidence of endoleak and no evidence of aneurysm growth  Ultrasound today shows normal ankle arm indices bilaterally in our office and no evidence of vein stenosis in his right above-knee to below-knee popliteal bypass  Impression and plan: Stable followup of stent graft repair of aortic aneurysm and above-knee to below-knee popliteal bypass on the right. I recommended we got back to every other year evaluation of both of these were CT of the abdomen and lower surety Doppler study

## 2012-07-28 ENCOUNTER — Other Ambulatory Visit: Payer: Self-pay | Admitting: *Deleted

## 2012-07-28 DIAGNOSIS — I724 Aneurysm of artery of lower extremity: Secondary | ICD-10-CM

## 2012-07-28 DIAGNOSIS — Z48812 Encounter for surgical aftercare following surgery on the circulatory system: Secondary | ICD-10-CM

## 2012-07-28 DIAGNOSIS — I714 Abdominal aortic aneurysm, without rupture: Secondary | ICD-10-CM

## 2012-08-25 ENCOUNTER — Emergency Department (HOSPITAL_COMMUNITY): Payer: Medicare Other

## 2012-08-25 ENCOUNTER — Encounter (HOSPITAL_COMMUNITY)
Admission: EM | Disposition: A | Discharge status: Home Health | Payer: Self-pay | Source: Home / Self Care | Attending: Thoracic Surgery (Cardiothoracic Vascular Surgery)

## 2012-08-25 ENCOUNTER — Inpatient Hospital Stay (HOSPITAL_COMMUNITY)
Admission: EM | Admit: 2012-08-25 | Discharge: 2012-09-08 | DRG: 233 | Disposition: A | Discharge status: Home Health | Payer: Medicare Other | Attending: Thoracic Surgery (Cardiothoracic Vascular Surgery) | Admitting: Thoracic Surgery (Cardiothoracic Vascular Surgery)

## 2012-08-25 ENCOUNTER — Encounter (HOSPITAL_COMMUNITY): Payer: Self-pay | Admitting: *Deleted

## 2012-08-25 DIAGNOSIS — Y846 Urinary catheterization as the cause of abnormal reaction of the patient, or of later complication, without mention of misadventure at the time of the procedure: Secondary | ICD-10-CM | POA: Diagnosis present

## 2012-08-25 DIAGNOSIS — Z8249 Family history of ischemic heart disease and other diseases of the circulatory system: Secondary | ICD-10-CM

## 2012-08-25 DIAGNOSIS — N170 Acute kidney failure with tubular necrosis: Secondary | ICD-10-CM | POA: Diagnosis not present

## 2012-08-25 DIAGNOSIS — Z9089 Acquired absence of other organs: Secondary | ICD-10-CM

## 2012-08-25 DIAGNOSIS — J9819 Other pulmonary collapse: Secondary | ICD-10-CM | POA: Diagnosis not present

## 2012-08-25 DIAGNOSIS — Y9210 Unspecified residential institution as the place of occurrence of the external cause: Secondary | ICD-10-CM | POA: Diagnosis present

## 2012-08-25 DIAGNOSIS — I739 Peripheral vascular disease, unspecified: Secondary | ICD-10-CM | POA: Diagnosis present

## 2012-08-25 DIAGNOSIS — F411 Generalized anxiety disorder: Secondary | ICD-10-CM | POA: Diagnosis not present

## 2012-08-25 DIAGNOSIS — Z6841 Body Mass Index (BMI) 40.0 and over, adult: Secondary | ICD-10-CM

## 2012-08-25 DIAGNOSIS — I2510 Atherosclerotic heart disease of native coronary artery without angina pectoris: Secondary | ICD-10-CM

## 2012-08-25 DIAGNOSIS — T8389XA Other specified complication of genitourinary prosthetic devices, implants and grafts, initial encounter: Secondary | ICD-10-CM | POA: Diagnosis not present

## 2012-08-25 DIAGNOSIS — Z87891 Personal history of nicotine dependence: Secondary | ICD-10-CM

## 2012-08-25 DIAGNOSIS — M109 Gout, unspecified: Secondary | ICD-10-CM | POA: Diagnosis present

## 2012-08-25 DIAGNOSIS — N189 Chronic kidney disease, unspecified: Secondary | ICD-10-CM | POA: Diagnosis present

## 2012-08-25 DIAGNOSIS — Z7982 Long term (current) use of aspirin: Secondary | ICD-10-CM

## 2012-08-25 DIAGNOSIS — E876 Hypokalemia: Secondary | ICD-10-CM | POA: Diagnosis not present

## 2012-08-25 DIAGNOSIS — I714 Abdominal aortic aneurysm, without rupture, unspecified: Secondary | ICD-10-CM | POA: Diagnosis present

## 2012-08-25 DIAGNOSIS — E119 Type 2 diabetes mellitus without complications: Secondary | ICD-10-CM | POA: Diagnosis present

## 2012-08-25 DIAGNOSIS — Z8546 Personal history of malignant neoplasm of prostate: Secondary | ICD-10-CM

## 2012-08-25 DIAGNOSIS — Z9861 Coronary angioplasty status: Secondary | ICD-10-CM

## 2012-08-25 DIAGNOSIS — R32 Unspecified urinary incontinence: Secondary | ICD-10-CM | POA: Diagnosis present

## 2012-08-25 DIAGNOSIS — Z79899 Other long term (current) drug therapy: Secondary | ICD-10-CM

## 2012-08-25 DIAGNOSIS — Z794 Long term (current) use of insulin: Secondary | ICD-10-CM

## 2012-08-25 DIAGNOSIS — E785 Hyperlipidemia, unspecified: Secondary | ICD-10-CM | POA: Diagnosis present

## 2012-08-25 DIAGNOSIS — I200 Unstable angina: Secondary | ICD-10-CM

## 2012-08-25 DIAGNOSIS — J9589 Other postprocedural complications and disorders of respiratory system, not elsewhere classified: Secondary | ICD-10-CM | POA: Diagnosis not present

## 2012-08-25 DIAGNOSIS — E781 Pure hyperglyceridemia: Secondary | ICD-10-CM | POA: Diagnosis present

## 2012-08-25 DIAGNOSIS — I4891 Unspecified atrial fibrillation: Secondary | ICD-10-CM | POA: Diagnosis present

## 2012-08-25 DIAGNOSIS — D62 Acute posthemorrhagic anemia: Secondary | ICD-10-CM | POA: Diagnosis not present

## 2012-08-25 DIAGNOSIS — E8779 Other fluid overload: Secondary | ICD-10-CM | POA: Diagnosis not present

## 2012-08-25 DIAGNOSIS — D696 Thrombocytopenia, unspecified: Secondary | ICD-10-CM | POA: Diagnosis not present

## 2012-08-25 DIAGNOSIS — I214 Non-ST elevation (NSTEMI) myocardial infarction: Principal | ICD-10-CM | POA: Diagnosis present

## 2012-08-25 DIAGNOSIS — I129 Hypertensive chronic kidney disease with stage 1 through stage 4 chronic kidney disease, or unspecified chronic kidney disease: Secondary | ICD-10-CM | POA: Diagnosis present

## 2012-08-25 DIAGNOSIS — N368 Other specified disorders of urethra: Secondary | ICD-10-CM | POA: Diagnosis present

## 2012-08-25 HISTORY — PX: LEFT HEART CATHETERIZATION WITH CORONARY ANGIOGRAM: SHX5451

## 2012-08-25 HISTORY — DX: Unspecified urinary incontinence: N36.42

## 2012-08-25 HISTORY — PX: CARDIAC CATHETERIZATION: SHX172

## 2012-08-25 HISTORY — DX: Malignant neoplasm of prostate: C61

## 2012-08-25 HISTORY — DX: Intrinsic sphincter deficiency (ISD): R32

## 2012-08-25 LAB — CBC WITH DIFFERENTIAL/PLATELET
Eosinophils Relative: 1 % (ref 0–5)
HCT: 43.2 % (ref 39.0–52.0)
Hemoglobin: 14.5 g/dL (ref 13.0–17.0)
Lymphocytes Relative: 10 % — ABNORMAL LOW (ref 12–46)
Lymphs Abs: 0.8 10*3/uL (ref 0.7–4.0)
MCV: 93.1 fL (ref 78.0–100.0)
Monocytes Relative: 6 % (ref 3–12)
Platelets: 161 10*3/uL (ref 150–400)
RBC: 4.64 MIL/uL (ref 4.22–5.81)
WBC: 8 10*3/uL (ref 4.0–10.5)

## 2012-08-25 LAB — MRSA PCR SCREENING: MRSA by PCR: NEGATIVE

## 2012-08-25 LAB — BASIC METABOLIC PANEL
BUN: 20 mg/dL (ref 6–23)
CO2: 29 mEq/L (ref 19–32)
Calcium: 9.1 mg/dL (ref 8.4–10.5)
Glucose, Bld: 237 mg/dL — ABNORMAL HIGH (ref 70–99)
Sodium: 137 mEq/L (ref 135–145)

## 2012-08-25 LAB — PROTIME-INR
INR: 1.03 (ref 0.00–1.49)
Prothrombin Time: 13.3 seconds (ref 11.6–15.2)

## 2012-08-25 LAB — GLUCOSE, CAPILLARY
Glucose-Capillary: 155 mg/dL — ABNORMAL HIGH (ref 70–99)
Glucose-Capillary: 233 mg/dL — ABNORMAL HIGH (ref 70–99)

## 2012-08-25 SURGERY — LEFT HEART CATHETERIZATION WITH CORONARY ANGIOGRAM
Anesthesia: LOCAL

## 2012-08-25 MED ORDER — HYDROCODONE-ACETAMINOPHEN 10-325 MG PO TABS
1.0000 | ORAL_TABLET | Freq: Four times a day (QID) | ORAL | Status: DC | PRN
  Administered 2012-08-25: 1 via ORAL
  Filled 2012-08-25: qty 1

## 2012-08-25 MED ORDER — HEPARIN BOLUS VIA INFUSION
4000.0000 [IU] | Freq: Once | INTRAVENOUS | Status: AC
  Administered 2012-08-25: 4000 [IU] via INTRAVENOUS

## 2012-08-25 MED ORDER — ACETAMINOPHEN 325 MG PO TABS: 650.0000 mg | ORAL_TABLET | ORAL | Status: DC | PRN

## 2012-08-25 MED ORDER — HEPARIN (PORCINE) IN NACL 100-0.45 UNIT/ML-% IJ SOLN
1150.0000 [IU]/h | INTRAMUSCULAR | Status: DC
  Administered 2012-08-25: 1150 [IU]/h via INTRAVENOUS
  Filled 2012-08-25: qty 250

## 2012-08-25 MED ORDER — SODIUM CHLORIDE 0.9 % IJ SOLN: 3.0000 mL | INTRAMUSCULAR | Status: DC | PRN

## 2012-08-25 MED ORDER — SODIUM CHLORIDE 0.9 % IJ SOLN: 3.0000 mL | Freq: Two times a day (BID) | INTRAMUSCULAR | Status: DC

## 2012-08-25 MED ORDER — HEPARIN (PORCINE) IN NACL 100-0.45 UNIT/ML-% IJ SOLN
2100.0000 [IU]/h | INTRAMUSCULAR | Status: DC
  Administered 2012-08-26: 1450 [IU]/h via INTRAVENOUS
  Filled 2012-08-25 (×2): qty 250

## 2012-08-25 MED ORDER — HEPARIN SODIUM (PORCINE) 1000 UNIT/ML IJ SOLN
INTRAMUSCULAR | Status: AC
  Filled 2012-08-25: qty 1

## 2012-08-25 MED ORDER — ISOSORBIDE MONONITRATE ER 60 MG PO TB24
60.0000 mg | ORAL_TABLET | Freq: Every day | ORAL | Status: DC
  Administered 2012-08-25: 60 mg via ORAL
  Filled 2012-08-25 (×2): qty 1

## 2012-08-25 MED ORDER — NITROGLYCERIN 0.2 MG/ML ON CALL CATH LAB
INTRAVENOUS | Status: AC
  Filled 2012-08-25: qty 1

## 2012-08-25 MED ORDER — ASPIRIN 81 MG PO CHEW
324.0000 mg | CHEWABLE_TABLET | Freq: Once | ORAL | Status: DC
  Filled 2012-08-25: qty 4

## 2012-08-25 MED ORDER — VERAPAMIL HCL 2.5 MG/ML IV SOLN
INTRAVENOUS | Status: AC
  Filled 2012-08-25: qty 2

## 2012-08-25 MED ORDER — ATENOLOL 50 MG PO TABS
50.0000 mg | ORAL_TABLET | Freq: Every day | ORAL | Status: DC
  Administered 2012-08-25 – 2012-08-26 (×2): 50 mg via ORAL
  Filled 2012-08-25 (×3): qty 1

## 2012-08-25 MED ORDER — CLONAZEPAM 0.5 MG PO TABS: 0.5000 mg | ORAL_TABLET | Freq: Two times a day (BID) | ORAL | Status: DC | PRN

## 2012-08-25 MED ORDER — SODIUM CHLORIDE 0.9 % IV SOLN: INTRAVENOUS | Status: AC

## 2012-08-25 MED ORDER — ASPIRIN 81 MG PO TABS: 81.0000 mg | ORAL_TABLET | Freq: Every day | ORAL | Status: DC

## 2012-08-25 MED ORDER — SODIUM CHLORIDE 0.9 % IV SOLN: 250.0000 mL | INTRAVENOUS | Status: DC | PRN

## 2012-08-25 MED ORDER — ONDANSETRON HCL 4 MG/2ML IJ SOLN: 4.0000 mg | Freq: Four times a day (QID) | INTRAMUSCULAR | Status: DC | PRN

## 2012-08-25 MED ORDER — BUPROPION HCL ER (XL) 300 MG PO TB24
300.0000 mg | ORAL_TABLET | Freq: Every day | ORAL | Status: DC
  Administered 2012-08-25 – 2012-09-08 (×13): 300 mg via ORAL
  Filled 2012-08-25 (×15): qty 1

## 2012-08-25 MED ORDER — ASPIRIN EC 81 MG PO TBEC
81.0000 mg | DELAYED_RELEASE_TABLET | Freq: Every day | ORAL | Status: DC
  Administered 2012-08-26: 81 mg via ORAL
  Filled 2012-08-25 (×2): qty 1

## 2012-08-25 MED ORDER — FUROSEMIDE 40 MG PO TABS
40.0000 mg | ORAL_TABLET | Freq: Two times a day (BID) | ORAL | Status: DC
  Administered 2012-08-25 – 2012-08-26 (×3): 40 mg via ORAL
  Filled 2012-08-25 (×5): qty 1

## 2012-08-25 MED ORDER — PAROXETINE HCL 20 MG PO TABS
40.0000 mg | ORAL_TABLET | Freq: Every day | ORAL | Status: DC
  Administered 2012-08-26 – 2012-09-08 (×13): 40 mg via ORAL
  Filled 2012-08-25 (×16): qty 2

## 2012-08-25 MED ORDER — ZOLPIDEM TARTRATE 5 MG PO TABS
5.0000 mg | ORAL_TABLET | Freq: Every evening | ORAL | Status: DC | PRN
  Administered 2012-08-26: 5 mg via ORAL
  Filled 2012-08-25: qty 1

## 2012-08-25 MED ORDER — NITROGLYCERIN IN D5W 200-5 MCG/ML-% IV SOLN
2.0000 ug/min | INTRAVENOUS | Status: DC
  Administered 2012-08-25: 5 ug/min via INTRAVENOUS
  Administered 2012-08-26: 25 ug/min via INTRAVENOUS
  Filled 2012-08-25 (×2): qty 250

## 2012-08-25 MED ORDER — ALLOPURINOL 300 MG PO TABS
300.0000 mg | ORAL_TABLET | Freq: Every day | ORAL | Status: DC
  Administered 2012-08-25 – 2012-09-08 (×13): 300 mg via ORAL
  Filled 2012-08-25 (×15): qty 1

## 2012-08-25 MED ORDER — HEPARIN (PORCINE) IN NACL 2-0.9 UNIT/ML-% IJ SOLN
INTRAMUSCULAR | Status: AC
  Filled 2012-08-25: qty 1000

## 2012-08-25 MED ORDER — LOSARTAN POTASSIUM 50 MG PO TABS
100.0000 mg | ORAL_TABLET | Freq: Every day | ORAL | Status: DC
  Administered 2012-08-25 – 2012-08-26 (×2): 100 mg via ORAL
  Filled 2012-08-25 (×3): qty 2

## 2012-08-25 MED ORDER — DIAZEPAM 5 MG PO TABS: 5.0000 mg | ORAL_TABLET | ORAL | Status: DC

## 2012-08-25 MED ORDER — CLOPIDOGREL BISULFATE 75 MG PO TABS: 75.0000 mg | ORAL_TABLET | Freq: Every day | ORAL | Status: DC

## 2012-08-25 MED ORDER — FENTANYL CITRATE 0.05 MG/ML IJ SOLN
INTRAMUSCULAR | Status: AC
  Filled 2012-08-25: qty 2

## 2012-08-25 MED ORDER — NITROGLYCERIN 0.4 MG SL SUBL: 0.4000 mg | SUBLINGUAL_TABLET | SUBLINGUAL | Status: DC | PRN

## 2012-08-25 MED ORDER — FUROSEMIDE 20 MG PO TABS
20.0000 mg | ORAL_TABLET | Freq: Every day | ORAL | Status: DC
  Administered 2012-08-25: 20 mg via ORAL
  Filled 2012-08-25: qty 1

## 2012-08-25 MED ORDER — LIDOCAINE HCL (PF) 1 % IJ SOLN
INTRAMUSCULAR | Status: AC
  Filled 2012-08-25: qty 30

## 2012-08-25 MED ORDER — POTASSIUM CHLORIDE ER 10 MEQ PO TBCR
10.0000 meq | EXTENDED_RELEASE_TABLET | Freq: Two times a day (BID) | ORAL | Status: DC
  Administered 2012-08-25 – 2012-08-26 (×3): 10 meq via ORAL
  Filled 2012-08-25 (×5): qty 1

## 2012-08-25 MED ORDER — SODIUM CHLORIDE 0.9 % IJ SOLN
3.0000 mL | Freq: Two times a day (BID) | INTRAMUSCULAR | Status: DC
  Administered 2012-08-25 – 2012-08-26 (×3): 3 mL via INTRAVENOUS

## 2012-08-25 MED ORDER — MIDAZOLAM HCL 2 MG/2ML IJ SOLN
INTRAMUSCULAR | Status: AC
  Filled 2012-08-25: qty 2

## 2012-08-25 MED ORDER — INSULIN ASPART 100 UNIT/ML ~~LOC~~ SOLN
0.0000 [IU] | Freq: Three times a day (TID) | SUBCUTANEOUS | Status: DC
  Administered 2012-08-25: 3 [IU] via SUBCUTANEOUS
  Administered 2012-08-26 (×3): 5 [IU] via SUBCUTANEOUS

## 2012-08-25 MED ORDER — AMLODIPINE BESYLATE 5 MG PO TABS
5.0000 mg | ORAL_TABLET | Freq: Every day | ORAL | Status: DC
  Administered 2012-08-25 – 2012-08-26 (×2): 5 mg via ORAL
  Filled 2012-08-25 (×3): qty 1

## 2012-08-25 NOTE — Care Management Note (Signed)
    Page 1 of 2   09/08/2012     4:27:11 PM   CARE MANAGEMENT NOTE 09/08/2012  Patient:  Max Anderson, Max Anderson   Account Number:  [id number]  Date Initiated:  08/25/2012  Documentation initiated by:  Elissa Hefty  Subjective/Objective Assessment:   adm w angina, cont chest pain     Action/Plan:   lives w wife, pcp dr Carloyn Manner fagan   Anticipated DC Date:  09/08/2012   Anticipated DC Plan:  [place]  CM consult      [facility] Choice  HOME HEALTH   Choice offered to / List presented to:  C-1 Patient        [place] arranged  HH-1 RN  [place] agency  [facility]   Status of service:  Completed, signed off Medicare Important Message given?   (If response is "NO", the following Medicare IM given date fields will be blank) Date Medicare IM given:   Date Additional Medicare IM given:    Discharge Disposition:  [place]  Per UR Regulation:  Reviewed for med. necessity/level of care/duration of stay  If discussed at [place] of Stay Meetings, dates discussed:    Comments:  Contact:  Max Anderson,Max Anderson [id number] [phone]  09/08/12 Max Nieves,RN,BSN [id number] PT FOR DC HOME TODAY.  FIRST CHOICE FOR HH CARE [facility] CANNOT SEE PT FOR ABOUT A WEEK.  CHECKED WITH MULTIPLE OTHER HH AGENCIES; [facility] ABLE TO STAFF CASE IN A TIMELY MANNER AND ACCEPTS PT'S INSURANCE. DAUGHTER GIVEN ALL INFO [id number] HOME CARE.  NO DME NEEDED FOR HOME.  PT WEANED FROM OXYGEN.  START OF CARE FOR HH 24-48H POST DC.  09/07/12 Max Atkison,RN,BSN [id number] PT WITH SLOW PROGRESSION.  POSSIBLE DC 1-2 DAYS.  WIFE GIVEN [facility] LIST OF PROVIDERS FOR REVIEW. WILL FOLLOW UP IN AM; ARRANGE SERVICES.  09/03/12 Max Wickard,RN,BSN [id number] P.T. RECOMMENDING SNF, BUT PT REFUSING.  WIFE FEELS SHE IS ABLE TO CARE FOR PT AT HOME.  WILL NEED HH FOLLOW UP AT DC. PT STATES HAS ALL NEEDED DME AT  HOME: [facility], [facility], [place], [place].  WILL CONT TO FOLLOW PROGRESS/ARRANGE HH SERVICES AS ORDERED.  08-30-12 10am Max Anderson, RNBSN [phone] Post Op CABG x4 on 08-30-12 - post op fib - NS on amio. Sitting up in chair with aersol mask on - wife at bedside. Plan for discharge is to go home with wife who will be with him 24/7.  CM will continue to follow for discharge needs.

## 2012-08-25 NOTE — Progress Notes (Signed)
Placed pt. On CPAP of 10cm H2O per pt.'s home settings with 2L O2 bled in via FFM (what pt. Wears at home) Pt. Is tolerating CPAP well at this time.

## 2012-08-25 NOTE — ED Notes (Signed)
Per Dr. Lilli Few approval - patient given ice chips.

## 2012-08-25 NOTE — Progress Notes (Signed)
ANTICOAGULATION CONSULT NOTE - Follow Up Consult  Pharmacy Consult for Heparin Indication: chest pain/ACS, pending CABG evaluation  Allergies  Allergen Reactions  . Niaspan (Niacin)   . Statins     Myalgias  . Tolectin (Tolmetin Sodium)     Patient Measurements: Height: 5\' 6"  (167.6 cm) Weight: 292 lb (132.45 kg) IBW/kg (Calculated) : 63.8 Heparin Dosing Weight: 96 kg  Vital Signs: Temp: 97.9 F (36.6 C) (07/16 1059) Temp src: Oral (07/16 1059) BP: 152/84 mmHg (07/16 1819) Pulse Rate: 80 (07/16 1819)  Labs:  Recent Labs  08/25/12 1145  HGB 14.5  HCT 43.2  PLT 161  APTT 27  LABPROT 13.3  INR 1.03  CREATININE 1.05  TROPONINI <0.30    Estimated Creatinine Clearance: 83.3 ml/min (by C-G formula based on Cr of 1.05).   Medications:  Infusions:  . sodium chloride    . nitroGLYCERIN 5 mcg/min (08/25/12 1218)   Sheath removed at 1722 per cath report  Assessment: 72 year old male s/p cath to restart Heparin 8 hours after sheath removal.  Goal of Therapy:  Heparin level 0.3-0.7 units/ml Monitor platelets by anticoagulation protocol: Yes   Plan:  No Heparin bolus d/t invasive procedure Start Heparin drip at 1150 units/hr at 0130 on 7/17 Check Heparin level and CBC 8 hours after starting Heparin  Legrand Como, Pharm.D., BCPS, AAHIVP Clinical Pharmacist Phone: [phone] or [phone] 08/25/2012, 6:50 PM

## 2012-08-25 NOTE — CV Procedure (Signed)
   Cardiac Catheterization Procedure Note  Name: Max Anderson MRN: [id number] DOB: 1940-02-11  Procedure: Left Heart Cath, Selective Coronary Angiography, LV angiography  Indication: unstable angina.   Medications:  Sedation:  1 mg IV Versed, 25 mcg IV Fentanyl  Contrast:  90 ml Omnipaque   Procedural Details: The right wrist was prepped, draped, and anesthetized with 1% lidocaine. Using the modified Seldinger technique, a 5 French sheath was introduced into the right radial artery. 3 mg of verapamil was administered through the sheath, weight-based unfractionated heparin was administered intravenously. A TIG catheter was used for selective coronary angiography. A pigtail catheter was used for left ventriculography. Catheter exchanges were performed over an exchange length guidewire. There were no immediate procedural complications. A TR band was used for radial hemostasis at the completion of the procedure.  The patient was transferred to the post catheterization recovery area for further monitoring.  Procedural Findings:  Hemodynamics: AO:  147/86   mmHg LV:  146/18    mmHg LVEDP: 32  mmHg  Coronary angiography: Coronary dominance: Right   Left Main:  Large in size with no significant disease.  Left Anterior Descending (LAD):  Normal in size with an 80-90% proximal stenosis followed by a large aneurysmal segment which is followed by a 20% stenosis. In the midsegment, there is 40% tubular stenosis inside the previously placed stent. The distal LAD has minor irregularities.  1st diagonal (D1):  Small in size with minor irregularities.  2nd diagonal (D2):  Normal in size with 20% ostial stenosis.  3rd diagonal (D3):  Normal in size with minor irregularities.  Circumflex (LCx):  Normal in size and nondominant. The vessel is occluded in the midsegment at the site of the previously placed stent.  1st obtuse marginal:  Small in size with no significant disease.  2nd obtuse  marginal:  Normal in size with 95% ostial stenosis. The distal vessel is underfilling but there might be diffuse 60% disease in the midsegment.  Right Coronary Artery: Large in size and dominant. A stent is noted proximally with mild 20% in-stent restenosis. There is diffuse 30% disease in the midsegment. There is 95% stenosis distally just between 2 aneurysmal segments.  Posterior descending artery: Large in size with minor irregularities.  Posterior AV segment: Normal in size with no significant disease.  Posterolateral branchs:  Large in size with no significant disease. These gets faint collaterals to the distal left circumflex distribution.  Left ventriculography: Left ventricular systolic function is normal , LVEF is estimated at 55 %, there is no significant mitral regurgitation   Final Conclusions:   1. Significant three-vessel coronary artery disease with large aneurysmal segment in the proximal LAD as well as aneurysmal segments in RCA. 2. Normal LV systolic function. 3. Moderately to severely elevated left ventricular end-diastolic pressure.  Recommendations:  cardiothoracic surgical consult for CABG is recommended. The patient has been on Plavix which will be stopped. I will start heparin 8 hours after sheath pull.  Kathlyn Sacramento MD, [facility] 08/25/2012, 5:27 PM

## 2012-08-25 NOTE — Interval H&P Note (Signed)
History and Physical Interval Note:  08/25/2012 4:34 PM  Max Anderson  has presented today for surgery, with the diagnosis of Chest pain  The various methods of treatment have been discussed with the patient and family. After consideration of risks, benefits and other options for treatment, the patient has consented to  Procedure(s): LEFT HEART CATHETERIZATION WITH CORONARY ANGIOGRAM (N/A) as a surgical intervention .  The patient's history has been reviewed, patient examined, no change in status, stable for surgery.  I have reviewed the patient's chart and labs.  Questions were answered to the patient's satisfaction.     Kathlyn Sacramento

## 2012-08-25 NOTE — ED Provider Notes (Signed)
History    CSN: [id number] Arrival date & time 08/25/12  68  First MD Initiated Contact with Patient 08/25/12 1108     Chief Complaint  Patient presents with  . Chest Pain   (Consider location/radiation/quality/duration/timing/severity/associated sxs/prior Treatment) HPI Pt with history of CAD and known LAD lesion reports he was sitting in a recliner this AM around 8:30 when he began to have moderate to severe midsternal chest pain, radiating into his R arm. He took NTG x 3 without improvement in CP but arm pain has improved. He denies SOB. EMS was called by wife, but he refused transport and came to the ED by private vehicle. He  Past Medical History  Diagnosis Date  . CAD (coronary artery disease)     multiple stents to the circ and right coronary artery. 2007 [facility]  . HTN (hypertension)   . AAA (abdominal aortic aneurysm)   . Gout   . Diabetes mellitus   . Cancer    Past Surgical History  Procedure Laterality Date  . Coronary stent placement       He had 2.5 x 20-mm Taxus stent placed in the  his circ in August 2007   He had 2.5 x 20-mm Taxus stent placed in the   mid circ and 2.5 x 16-mm stent placed in the distal circ.  [name] Nose surgery    . Prostate surgery  1998  . Appendectomy    . Cholecystectomy    . Ankle fracture surgery    . Above -knee to below knee popliteal bypass  novemeber 2010  . Knee surgery  10-2010  . Back surgery     Family History  Problem Relation Age of Onset  . Heart attack Mother    History  Substance Use Topics  . Smoking status: Former Smoker    Quit date: 02/11/1987  . Smokeless tobacco: Not on file  . Alcohol Use: No    Review of Systems All other systems reviewed and are negative except as noted in HPI.   Allergies  Niaspan and Tolectin  Home Medications   Current Outpatient Rx  Name  Route  Sig  Dispense  Refill  . allopurinol (ZYLOPRIM) 300 MG tablet   Oral   Take 300 mg by mouth daily.           [name] amLODipine  (NORVASC) 5 MG tablet   Oral   Take 5 mg by mouth daily.         [name] aspirin 81 MG tablet   Oral   Take 81 mg by mouth daily.           [name] atenolol (TENORMIN) 50 MG tablet   Oral   Take 50 mg by mouth daily.         [name] buPROPion (WELLBUTRIN XL) 300 MG 24 hr tablet   Oral   Take 300 mg by mouth daily.         . clonazePAM (KLONOPIN) 0.5 MG tablet   Oral   Take 0.5 mg by mouth 2 (two) times daily as needed for anxiety. Takes 1 tablet daily.         . clopidogrel (PLAVIX) 75 MG tablet   Oral   Take 75 mg by mouth daily.          . furosemide (LASIX) 20 MG tablet   Oral   Take 20 mg by mouth daily.         [name] HYDROcodone-acetaminophen (NORCO) 10-325 MG per tablet  Oral   Take 1 tablet by mouth every 6 (six) hours as needed for pain.         [name] insulin NPH-insulin regular (HUMULIN 70/30) (70-30) 100 UNIT/ML injection   Subcutaneous   Inject 110 Units into the skin 2 (two) times daily with a meal. As directed         . isosorbide mononitrate (IMDUR) 60 MG 24 hr tablet   Oral   Take 60 mg by mouth daily.         [name] losartan (COZAAR) 100 MG tablet   Oral   Take 100 mg by mouth daily.         . nitroGLYCERIN (NITROSTAT) 0.4 MG SL tablet   Sublingual   Place 0.4 mg under the tongue every 5 (five) minutes as needed for chest pain.         [name] PARoxetine (PAXIL) 40 MG tablet   Oral   Take 40 mg by mouth every morning.           . potassium chloride (K-DUR) 10 MEQ tablet   Oral   Take 10 mEq by mouth 2 (two) times daily.          BP 158/91  Pulse 72  Temp(Src) 97.9 F (36.6 C) (Oral)  Resp 20  Ht 5\' 6"  (1.676 m)  Wt 292 lb (132.45 kg)  BMI 47.15 kg/m2  SpO2 96% Physical Exam  Nursing note and vitals reviewed. Constitutional: He is oriented to person, place, and time. He appears well-developed and well-nourished.  HENT:  Head: Normocephalic and atraumatic.  Eyes: EOM are normal. Pupils are equal, round, and reactive to light.  Neck: Normal  range of motion. Neck supple.  Cardiovascular: Normal rate, normal heart sounds and intact distal pulses.   Pulmonary/Chest: Effort normal and breath sounds normal. He exhibits no tenderness.  Abdominal: Bowel sounds are normal. He exhibits no distension. There is no tenderness.  Musculoskeletal: Normal range of motion. He exhibits no edema and no tenderness.  Neurological: He is alert and oriented to person, place, and time. He has normal strength. No cranial nerve deficit or sensory deficit.  Skin: Skin is warm and dry. No rash noted.  Psychiatric: He has a normal mood and affect.    ED Course  Procedures (including critical care time)  CRITICAL CARE Performed by: Truddie Hidden. Total critical care time: 35 Critical care time was exclusive of separately billable procedures and treating other patients. Critical care was necessary to treat or prevent imminent or life-threatening deterioration. Critical care was time spent personally by me on the following activities: development of treatment plan with patient and/or surrogate as well as nursing, discussions with consultants, evaluation of patient's response to treatment, examination of patient, obtaining history from patient or surrogate, ordering and performing treatments and interventions, ordering and review of laboratory studies, ordering and review of radiographic studies, pulse oximetry and re-evaluation of patient's condition.  Labs Reviewed  CBC WITH DIFFERENTIAL - Abnormal; Notable for the following:    Neutrophils Relative % 83 (*)    Lymphocytes Relative 10 (*)    All other components within normal limits  BASIC METABOLIC PANEL - Abnormal; Notable for the following:    Glucose, Bld 237 (*)    GFR calc non Af Amer 69 (*)    GFR calc Af Amer 80 (*)    All other components within normal limits  TROPONIN I  PROTIME-INR  APTT   Dg Chest Port 1 View  08/25/2012   *  RADIOLOGY REPORT*  Clinical Data: Chest pain.  PORTABLE  CHEST - 1 VIEW  Comparison: 10/22/2010  Findings: Lung volumes are very low bilaterally.  Heart size is within normal limits.  There is no evidence of overt edema, airspace consolidation or pleural fluid.  IMPRESSION: Low lung volumes.   Original Report Authenticated By: Aletta Edouard, M.D.   1. Unstable angina     MDM   Date: 08/25/2012  Rate: 70  Rhythm: normal sinus rhythm  QRS Axis: normal  Intervals: normal  ST/T Wave abnormalities: ST depressions inferiorly  Conduction Disutrbances:none  Narrative Interpretation:   Old EKG Reviewed: changes noted, ST depressions new from 07/14/2011  Pt still having pain after SL NTG at home, will start Nitro drip, morphine IV and heparin. New ischemic EKG changes from last year. Has notes from Dr. [name] regarding an LAD lesion and aneurysm but I cannot find the actual cath report. Will discuss with Cardiology.   1:17 PM Labs unremarkable. Pt treated for unstable angina with NTG and Heparin drip. Spoke with Dr. Angelena Form with [facility] who has accepted in transfer.    Charles B. Karle Starch, MD 08/25/12 1318

## 2012-08-25 NOTE — Progress Notes (Signed)
CRITICAL VALUE ALERT  Critical value received:  Troponin >20  Date of notification:  08/25/2012  Time of notification: 1955  Critical value read back:yes  Nurse who received alert:  [name]  MD notified (1st page):  Dr. Claiborne Billings  Time of first page:  2025  MD notified (2nd page):  Time of second page:  Responding MD:  Dr. Claiborne Billings  Time MD responded:  2048

## 2012-08-25 NOTE — Progress Notes (Signed)
ANTICOAGULATION CONSULT NOTE - Initial Consult  Pharmacy Consult for Heparin Indication: chest pain/ACS  Allergies  Allergen Reactions  . Niaspan (Niacin)   . Tolectin (Tolmetin Sodium)     Patient Measurements: Height: 5\' 6"  (167.6 cm) Weight: 292 lb (132.45 kg) IBW/kg (Calculated) : 63.8 Heparin Dosing Weight: 95.8 kg  Vital Signs: Temp: 97.9 F (36.6 C) (07/16 1059) Temp src: Oral (07/16 1059) BP: 158/91 mmHg (07/16 1059) Pulse Rate: 72 (07/16 1059)  Labs:  Recent Labs  08/25/12 1145  HGB 14.5  HCT 43.2  PLT 161  APTT 27  LABPROT 13.3  INR 1.03  CREATININE 1.05  TROPONINI <0.30    Estimated Creatinine Clearance: 83.3 ml/min (by C-G formula based on Cr of 1.05).   Medical History: Past Medical History  Diagnosis Date  . CAD (coronary artery disease)     multiple stents to the circ and right coronary artery. 2007 [facility]  . HTN (hypertension)   . AAA (abdominal aortic aneurysm)   . Gout   . Diabetes mellitus   . Cancer     Medications:  Scheduled:  . aspirin  324 mg Oral Once    Assessment: Heparin protocol for ACS/STEMI PTA medications reviewed Labs reviewed Obese patient  Goal of Therapy:  Heparin level 0.3-0.7 units/ml Monitor platelets by anticoagulation protocol: Yes   Plan:  Give 4000 units bolus x 1 Start heparin infusion at 1150 units/hr Check anti-Xa level in 8 hours and daily while on heparin Continue to monitor H&H and platelets  Abner Greenspan, Michal Strzelecki Bennett 08/25/2012,12:21 PM

## 2012-08-25 NOTE — Progress Notes (Signed)
ANTICOAGULATION CONSULT NOTE - Initial Consult  Pharmacy Consult for heparin Indication: chest pain/ACS  Allergies  Allergen Reactions  . Niaspan (Niacin)   . Statins     Myalgias  . Tolectin (Tolmetin Sodium)     Patient Measurements: Height: 5\' 6"  (167.6 cm) Weight: 292 lb (132.45 kg) IBW/kg (Calculated) : 63.8 Heparin Dosing Weight:  96kg 80 Vital Signs: Temp: 97.9 F (36.6 C) (07/16 1059) Temp src: Oral (07/16 1059) BP: 140/81 mmHg (07/16 1535) Pulse Rate: 76 (07/16 1311)  Labs:  Recent Labs  08/25/12 1145  HGB 14.5  HCT 43.2  PLT 161  APTT 27  LABPROT 13.3  INR 1.03  CREATININE 1.05  TROPONINI <0.30    Estimated Creatinine Clearance: 83.3 ml/min (by C-G formula based on Cr of 1.05).   Medical History: Past Medical History  Diagnosis Date  . CAD (coronary artery disease)     multiple stents to the circ and right coronary artery. 2007 [facility]  . HTN (hypertension)   . AAA (abdominal aortic aneurysm)   . Gout   . Diabetes mellitus   . Cancer     Medications:  Prescriptions prior to admission  Medication Sig Dispense Refill  . allopurinol (ZYLOPRIM) 300 MG tablet Take 300 mg by mouth daily.        [name] amLODipine (NORVASC) 5 MG tablet Take 5 mg by mouth daily.      [name] aspirin 81 MG tablet Take 81 mg by mouth daily.        [name] atenolol (TENORMIN) 50 MG tablet Take 50 mg by mouth daily.      [name] buPROPion (WELLBUTRIN XL) 300 MG 24 hr tablet Take 300 mg by mouth daily.      . clonazePAM (KLONOPIN) 0.5 MG tablet Take 0.5 mg by mouth 2 (two) times daily as needed for anxiety. Takes 1 tablet daily.      . clopidogrel (PLAVIX) 75 MG tablet Take 75 mg by mouth daily.       . furosemide (LASIX) 20 MG tablet Take 20 mg by mouth daily.      [name] HYDROcodone-acetaminophen (NORCO) 10-325 MG per tablet Take 1 tablet by mouth every 6 (six) hours as needed for pain.      [name] insulin NPH-insulin regular (HUMULIN 70/30) (70-30) 100 UNIT/ML injection Inject 110 Units into the skin 2  (two) times daily with a meal. As directed      . isosorbide mononitrate (IMDUR) 60 MG 24 hr tablet Take 60 mg by mouth daily.      [name] losartan (COZAAR) 100 MG tablet Take 100 mg by mouth daily.      . nitroGLYCERIN (NITROSTAT) 0.4 MG SL tablet Place 0.4 mg under the tongue every 5 (five) minutes as needed for chest pain.      [name] PARoxetine (PAXIL) 40 MG tablet Take 40 mg by mouth every morning.        . potassium chloride (K-DUR) 10 MEQ tablet Take 10 mEq by mouth 2 (two) times daily.       Scheduled:  . allopurinol  300 mg Oral Daily  . amLODipine  5 mg Oral Daily  . aspirin  324 mg Oral Once  . [START ON 08/26/2012] aspirin EC  81 mg Oral Daily  . atenolol  50 mg Oral Daily  . buPROPion  300 mg Oral Daily  . clopidogrel  75 mg Oral Daily  . diazepam  5 mg Oral On Call  . furosemide  20 mg Oral Daily  .  insulin aspart  0-15 Units Subcutaneous TID WC  . isosorbide mononitrate  60 mg Oral Daily  . losartan  100 mg Oral Daily  . [START ON 08/26/2012] PARoxetine  40 mg Oral Q0600  . potassium chloride  10 mEq Oral BID  . sodium chloride  3 mL Intravenous Q12H  . sodium chloride  3 mL Intravenous Q12H   Infusions:  . heparin 1,150 Units/hr (08/25/12 1255)  . nitroGLYCERIN 5 mcg/min (08/25/12 1218)    Assessment: 72 yo male here with CP and pharmacy to dose heparin. Patient is from [facility] and received a 4000 units heparin bolus at ~ 1pm and currently on heparin at 1150 units/hr (~12 units/kg/hr). Noted plans for cath today.  Goal of Therapy:  Heparin level 0.3-0.7 units/ml Monitor platelets by anticoagulation protocol: Yes   Plan:  -Continue heparin at current rate -Will follow plans post cath  Hildred Laser, Pharm D 08/25/2012 4:13 PM

## 2012-08-25 NOTE — H&P (Signed)
Patient ID: Max Anderson MRN: [id number] DOB/AGE: 1940/09/07 72 y.o. Admit date: 08/25/2012  Primary Care Physician:  Primary Cardiologist: [name]  HPI: 72 yo male with history of CAD, HTN, DM, AAA, gout admitted on transfer from [facility] ED with c/o chest pain. He has a history of CAD with last cath in 2012 at which time he was found to have severe disease in the proximal LAD just before a large aneurysmal segment. Flow wire analysis of the proximal LAD at that time with FFR of 0.81 so medical management was recommended. There was moderate disease in the remainder of the LAD, the Circumflex and the RCA. He has been managed medically over the last 2 years. He presents today with c/o chest pressure since am today. Some resolution with IV NTG in ED but now 7/10 chest pain. No SOB.   Review of systems complete and found to be negative unless listed above   Past Medical History  Diagnosis Date  . CAD (coronary artery disease)     multiple stents to the circ and right coronary artery. 2007 [facility]  . HTN (hypertension)   . AAA (abdominal aortic aneurysm)   . Gout   . Diabetes mellitus   . Cancer     Family History  Problem Relation Age of Onset  . Heart attack Mother     History   Social History  . Marital Status: Married    Spouse Name: N/A    Number of Children: N/A  . Years of Education: N/A   Occupational History  . Not on file.   Social History Main Topics  . Smoking status: Former Smoker    Quit date: 02/11/1987  . Smokeless tobacco: Not on file  . Alcohol Use: No  . Drug Use: No  . Sexually Active: Not on file   Other Topics Concern  . Not on file   Social History Narrative   The patient is retired.  He lives in the [place] area.        He enjoys hunting and 4-wheeling.    Past Surgical History  Procedure Laterality Date  . Coronary stent placement       He had 2.5 x 20-mm Taxus stent placed in the  his circ in August 2007   He had 2.5 x 20-mm Taxus  stent placed in the   mid circ and 2.5 x 16-mm stent placed in the distal circ.  [name] Nose surgery    . Prostate surgery  1998  . Appendectomy    . Cholecystectomy    . Ankle fracture surgery    . Above -knee to below knee popliteal bypass  novemeber 2010  . Knee surgery  10-2010  . Back surgery      Allergies  Allergen Reactions  . Niaspan (Niacin)   . Tolectin (Tolmetin Sodium)     Prior to Admission Meds:  Prescriptions prior to admission  Medication Sig Dispense Refill  . allopurinol (ZYLOPRIM) 300 MG tablet Take 300 mg by mouth daily.        [name] amLODipine (NORVASC) 5 MG tablet Take 5 mg by mouth daily.      [name] aspirin 81 MG tablet Take 81 mg by mouth daily.        [name] atenolol (TENORMIN) 50 MG tablet Take 50 mg by mouth daily.      [name] buPROPion (WELLBUTRIN XL) 300 MG 24 hr tablet Take 300 mg by mouth daily.      . clonazePAM (  KLONOPIN) 0.5 MG tablet Take 0.5 mg by mouth 2 (two) times daily as needed for anxiety. Takes 1 tablet daily.      . clopidogrel (PLAVIX) 75 MG tablet Take 75 mg by mouth daily.       . furosemide (LASIX) 20 MG tablet Take 20 mg by mouth daily.      [name] HYDROcodone-acetaminophen (NORCO) 10-325 MG per tablet Take 1 tablet by mouth every 6 (six) hours as needed for pain.      [name] insulin NPH-insulin regular (HUMULIN 70/30) (70-30) 100 UNIT/ML injection Inject 110 Units into the skin 2 (two) times daily with a meal. As directed      . isosorbide mononitrate (IMDUR) 60 MG 24 hr tablet Take 60 mg by mouth daily.      [name] losartan (COZAAR) 100 MG tablet Take 100 mg by mouth daily.      . nitroGLYCERIN (NITROSTAT) 0.4 MG SL tablet Place 0.4 mg under the tongue every 5 (five) minutes as needed for chest pain.      [name] PARoxetine (PAXIL) 40 MG tablet Take 40 mg by mouth every morning.        . potassium chloride (K-DUR) 10 MEQ tablet Take 10 mEq by mouth 2 (two) times daily.        Physical Exam: Blood pressure 129/90, pulse 76, temperature 97.9 F (36.6 C), temperature source  Oral, resp. rate 20, height 5\' 6"  (1.676 m), weight 292 lb (132.45 kg), SpO2 94.00%.   General: Well developed, well nourished, NAD  HEENT: OP clear, mucus membranes moist  SKIN: warm, dry. No rashes.  Neuro: No focal deficits  Musculoskeletal: Muscle strength 5/5 all ext  Psychiatric: Mood and affect normal  Neck: No JVD, no carotid bruits, no thyromegaly, no lymphadenopathy.  Lungs:Clear bilaterally, no wheezes, rhonci, crackles  Cardiovascular: Regular rate and rhythm. No murmurs, gallops or rubs.  Abdomen:Soft. Bowel sounds present. Non-tender.  Extremities: No lower extremity edema. Pulses are 2 + in the bilateral DP/PT.   Labs:   Lab Results  Component Value Date   WBC 8.0 08/25/2012   HGB 14.5 08/25/2012   HCT 43.2 08/25/2012   MCV 93.1 08/25/2012   PLT 161 08/25/2012    Recent Labs Lab 08/25/12 1145  NA 137  K 4.5  CL 101  CO2 29  BUN 20  CREATININE 1.05  CALCIUM 9.1  GLUCOSE 237*   Lab Results  Component Value Date   TROPONINI <0.30 08/25/2012    Cardiac cath 06/18/10: Left main coronary artery had a 20% discrete stenosis.  Left anterior descending artery had a 70-80% discrete stenosis. Right  after this stenosis was a very large aneurysmal segment to the proximal  LAD. The mid LAD had diffuse 40-50% tubular disease inside of a small  stent. The distal LAD had 20-30% multiple discrete lesions and was a  large vessel that reached the apex. The first, second, and third  diagonal branches had minor disease and the mid vessel stent appeared to  be placed just distal to the first diagonal branch.  Circumflex coronary artery was nondominant.  There was a 40% discrete lesion at the takeoff of the first obtuse  marginal branch. The second obtuse marginal branch were what would be  the continuation of the AV groove branch was a very large vessel. There  was a widely patent stent in this AV groove or second obtuse marginal  branch.  There was some tortuosity at the  takeoff of the first obtuse marginal  branch but I do not believe there was a significant stenosis. They are  probably only in the 40% range at the bifurcation with the stent being  widely patent distal to that.  The right coronary artery was dominant. There also appeared to be a  stent in the proximal portion of the right coronary artery.  There was 20-30% residual disease in the proximal portion, the midvessel  had 40% tubular disease. Interestingly, the entire right coronary  artery also had aneurysmal segments. The distal right coronary artery  had 30-40% multiple discrete lesions surrounding two aneurysmal  segments. There was a large posterolateral branch and PDA without  significant disease.   RAO ventriculography: RAO ventriculography showed hyperdynamic  function. EF was 65%. There was no gradient across the aortic valve  and no MR.  Radiology:  EKG: NSR, rate 78 bpm. Poor R wave progression precordial leads. ST depression anterolateral leads.   ASSESSMENT AND PLAN:   1. CAD/Unstable angina: Will admit to stepdown unit. Will continue IV heparin, IV NTG. Cycle cardiac markers. Continue home meds. Cath today with ongoing chest pain. INR 1.0. Labs reviewed.     Max Anderson 08/25/2012, 3:23 PM

## 2012-08-25 NOTE — ED Notes (Signed)
[facility] called, gave report.

## 2012-08-25 NOTE — ED Notes (Signed)
Chest pain began at 0800. Pt also states right arm pain began at same time. States pain only to right wrist at this time with hx of carpel tunnel to same wrist. Entire right arm was involved initially. First responders on scene but pt refused transport because he states he did not want to go to [place]. Pt took three NTG PTA without relief

## 2012-08-26 ENCOUNTER — Other Ambulatory Visit: Payer: Self-pay | Admitting: *Deleted

## 2012-08-26 ENCOUNTER — Encounter (HOSPITAL_COMMUNITY): Payer: Self-pay | Admitting: Thoracic Surgery (Cardiothoracic Vascular Surgery)

## 2012-08-26 ENCOUNTER — Inpatient Hospital Stay (HOSPITAL_COMMUNITY): Payer: Medicare Other

## 2012-08-26 DIAGNOSIS — N3642 Intrinsic sphincter deficiency (ISD): Secondary | ICD-10-CM | POA: Insufficient documentation

## 2012-08-26 DIAGNOSIS — I214 Non-ST elevation (NSTEMI) myocardial infarction: Secondary | ICD-10-CM

## 2012-08-26 DIAGNOSIS — I059 Rheumatic mitral valve disease, unspecified: Secondary | ICD-10-CM

## 2012-08-26 DIAGNOSIS — Z01810 Encounter for preprocedural cardiovascular examination: Secondary | ICD-10-CM

## 2012-08-26 DIAGNOSIS — I2510 Atherosclerotic heart disease of native coronary artery without angina pectoris: Secondary | ICD-10-CM

## 2012-08-26 LAB — POCT I-STAT 3, ART BLOOD GAS (G3+)
Acid-Base Excess: 2 mmol/L (ref 0.0–2.0)
Bicarbonate: 26 mEq/L — ABNORMAL HIGH (ref 20.0–24.0)
pCO2 arterial: 37.7 mmHg (ref 35.0–45.0)
pH, Arterial: 7.446 (ref 7.350–7.450)
pO2, Arterial: 61 mmHg — ABNORMAL LOW (ref 80.0–100.0)

## 2012-08-26 LAB — LIPID PANEL
Cholesterol: 243 mg/dL — ABNORMAL HIGH (ref 0–200)
LDL Cholesterol: UNDETERMINED mg/dL (ref 0–99)
Total CHOL/HDL Ratio: 10.1 RATIO
VLDL: UNDETERMINED mg/dL (ref 0–40)

## 2012-08-26 LAB — CBC
Hemoglobin: 13.9 g/dL (ref 13.0–17.0)
MCHC: 34 g/dL (ref 30.0–36.0)
RBC: 4.46 MIL/uL (ref 4.22–5.81)
WBC: 8.7 10*3/uL (ref 4.0–10.5)

## 2012-08-26 LAB — GLUCOSE, CAPILLARY
Glucose-Capillary: 241 mg/dL — ABNORMAL HIGH (ref 70–99)
Glucose-Capillary: 250 mg/dL — ABNORMAL HIGH (ref 70–99)

## 2012-08-26 LAB — ABO/RH: ABO/RH(D): A POS

## 2012-08-26 LAB — URINALYSIS, ROUTINE W REFLEX MICROSCOPIC
Bilirubin Urine: NEGATIVE
Ketones, ur: NEGATIVE mg/dL
Nitrite: NEGATIVE
pH: 6 (ref 5.0–8.0)

## 2012-08-26 LAB — TYPE AND SCREEN: Antibody Screen: NEGATIVE

## 2012-08-26 LAB — HEMOGLOBIN A1C: Hgb A1c MFr Bld: 7.7 % — ABNORMAL HIGH (ref <5.7)

## 2012-08-26 LAB — T4, FREE: Free T4: 0.95 ng/dL (ref 0.80–1.80)

## 2012-08-26 LAB — HEPARIN LEVEL (UNFRACTIONATED): Heparin Unfractionated: <0.1 IU/mL — ABNORMAL LOW (ref 0.30–0.70)

## 2012-08-26 LAB — MAGNESIUM: Magnesium: 1.7 mg/dL (ref 1.5–2.5)

## 2012-08-26 MED ORDER — DEXMEDETOMIDINE HCL IN NACL 400 MCG/100ML IV SOLN
0.1000 ug/kg/h | INTRAVENOUS | Status: AC
  Administered 2012-08-27: 0.3 ug/kg/h via INTRAVENOUS
  Filled 2012-08-26: qty 100

## 2012-08-26 MED ORDER — SODIUM CHLORIDE 0.9 % IV SOLN
INTRAVENOUS | Status: AC
  Administered 2012-08-27: 70 mL/h via INTRAVENOUS
  Administered 2012-08-27: 14 mL/h via INTRAVENOUS
  Filled 2012-08-26: qty 40

## 2012-08-26 MED ORDER — CHLORHEXIDINE GLUCONATE 4 % EX LIQD
60.0000 mL | Freq: Once | CUTANEOUS | Status: AC
  Administered 2012-08-26: 4 via TOPICAL
  Filled 2012-08-26: qty 60

## 2012-08-26 MED ORDER — MAGNESIUM OXIDE 400 (241.3 MG) MG PO TABS
400.0000 mg | ORAL_TABLET | Freq: Two times a day (BID) | ORAL | Status: AC
  Administered 2012-08-26 (×2): 400 mg via ORAL
  Filled 2012-08-26 (×2): qty 1

## 2012-08-26 MED ORDER — VANCOMYCIN HCL 10 G IV SOLR
1250.0000 mg | INTRAVENOUS | Status: DC
  Filled 2012-08-26: qty 1250

## 2012-08-26 MED ORDER — VANCOMYCIN HCL 10 G IV SOLR
1500.0000 mg | INTRAVENOUS | Status: AC
  Administered 2012-08-27: 1500 mg via INTRAVENOUS
  Filled 2012-08-26: qty 1500

## 2012-08-26 MED ORDER — BISACODYL 5 MG PO TBEC
5.0000 mg | DELAYED_RELEASE_TABLET | Freq: Once | ORAL | Status: AC
  Administered 2012-08-26: 5 mg via ORAL
  Filled 2012-08-26: qty 1

## 2012-08-26 MED ORDER — DOPAMINE-DEXTROSE 3.2-5 MG/ML-% IV SOLN
2.0000 ug/kg/min | INTRAVENOUS | Status: DC
  Filled 2012-08-26: qty 250

## 2012-08-26 MED ORDER — DEXTROSE 5 % IV SOLN
30.0000 ug/min | INTRAVENOUS | Status: AC
  Administered 2012-08-27: 25 ug/min via INTRAVENOUS
  Administered 2012-08-27: 10 ug/min via INTRAVENOUS
  Filled 2012-08-26: qty 2

## 2012-08-26 MED ORDER — DEXTROSE 5 % IV SOLN
1.5000 g | INTRAVENOUS | Status: AC
  Administered 2012-08-27: 1.5 g via INTRAVENOUS
  Administered 2012-08-27: .75 g via INTRAVENOUS
  Filled 2012-08-26: qty 1.5

## 2012-08-26 MED ORDER — DIAZEPAM 5 MG PO TABS
5.0000 mg | ORAL_TABLET | Freq: Once | ORAL | Status: AC
  Administered 2012-08-27: 5 mg via ORAL
  Filled 2012-08-26: qty 1

## 2012-08-26 MED ORDER — POTASSIUM CHLORIDE 2 MEQ/ML IV SOLN
80.0000 meq | INTRAVENOUS | Status: DC
  Filled 2012-08-26: qty 40

## 2012-08-26 MED ORDER — ALBUTEROL SULFATE (5 MG/ML) 0.5% IN NEBU
2.5000 mg | INHALATION_SOLUTION | Freq: Once | RESPIRATORY_TRACT | Status: AC
  Administered 2012-08-26: 2.5 mg via RESPIRATORY_TRACT

## 2012-08-26 MED ORDER — SODIUM CHLORIDE 0.9 % IV SOLN
INTRAVENOUS | Status: DC
  Filled 2012-08-26: qty 30

## 2012-08-26 MED ORDER — MAGNESIUM SULFATE 50 % IJ SOLN
40.0000 meq | INTRAMUSCULAR | Status: DC
  Filled 2012-08-26: qty 10

## 2012-08-26 MED ORDER — MORPHINE SULFATE 2 MG/ML IJ SOLN
2.0000 mg | INTRAMUSCULAR | Status: DC | PRN
  Administered 2012-08-26 (×2): 2 mg via INTRAVENOUS
  Filled 2012-08-26 (×2): qty 1

## 2012-08-26 MED ORDER — METOPROLOL TARTRATE 12.5 MG HALF TABLET
12.5000 mg | ORAL_TABLET | Freq: Once | ORAL | Status: AC
  Administered 2012-08-27: 12.5 mg via ORAL
  Filled 2012-08-26: qty 1

## 2012-08-26 MED ORDER — MAGNESIUM OXIDE 400 (241.3 MG) MG PO TABS: 400.0000 mg | ORAL_TABLET | Freq: Two times a day (BID) | ORAL | Status: DC

## 2012-08-26 MED ORDER — DEXTROSE 5 % IV SOLN
750.0000 mg | INTRAVENOUS | Status: DC
  Filled 2012-08-26: qty 750

## 2012-08-26 MED ORDER — SODIUM CHLORIDE 0.9 % IV SOLN
INTRAVENOUS | Status: AC
  Administered 2012-08-27: 1 [IU]/h via INTRAVENOUS
  Administered 2012-08-27: 7.1 [IU]/h via INTRAVENOUS
  Filled 2012-08-26: qty 1

## 2012-08-26 MED ORDER — ALPRAZOLAM 0.25 MG PO TABS: 0.2500 mg | ORAL_TABLET | ORAL | Status: DC | PRN

## 2012-08-26 MED ORDER — EPINEPHRINE HCL 1 MG/ML IJ SOLN
0.5000 ug/min | INTRAVENOUS | Status: DC
  Filled 2012-08-26: qty 4

## 2012-08-26 MED ORDER — PLASMA-LYTE 148 IV SOLN
INTRAVENOUS | Status: AC
  Administered 2012-08-27: 09:00:00
  Filled 2012-08-26: qty 2.5

## 2012-08-26 MED ORDER — NITROGLYCERIN IN D5W 200-5 MCG/ML-% IV SOLN
2.0000 ug/min | INTRAVENOUS | Status: AC
  Administered 2012-08-27: 10 ug/min via INTRAVENOUS
  Filled 2012-08-26: qty 250

## 2012-08-26 MED ORDER — CHLORHEXIDINE GLUCONATE 4 % EX LIQD
60.0000 mL | Freq: Once | CUTANEOUS | Status: AC
  Administered 2012-08-27: 4 via TOPICAL

## 2012-08-26 MED ORDER — TEMAZEPAM 15 MG PO CAPS: 15.0000 mg | ORAL_CAPSULE | Freq: Once | ORAL | Status: DC | PRN

## 2012-08-26 NOTE — Consult Note (Signed)
Urology Consult  Referring physician:  Dr Erasmo Leventhal Reason for referral: Catheter insertiion  Chief Complaint: GU sphincter  History of Present Illness:  The patient is a 72 years old male who is scheduled for coronary artery bypass surgery in AM.  He has an artificial urinary sphincter that was inserted in 1998 in [place].  He has mild urinary incontinence. He states that he has to strain to urinate. I was asked to see him for catheter insertion prior to surgery.  Past Medical History  Diagnosis Date  . CAD (coronary artery disease)     multiple stents to the circ and right coronary artery. 2007 [facility]  . HTN (hypertension)   . AAA (abdominal aortic aneurysm)   . Gout   . Diabetes mellitus   . Prostate cancer   . Urinary incontinence due to urethral sphincter incompetence    Past Surgical History  Procedure Laterality Date  . Coronary stent placement       He had 2.5 x 20-mm Taxus stent placed in the  his circ in August 2007   He had 2.5 x 20-mm Taxus stent placed in the   mid circ and 2.5 x 16-mm stent placed in the distal circ.  [name] Nose surgery    . Prostate surgery  1998    has urinary prosthesis for incontinence  . Appendectomy    . Cholecystectomy    . Ankle fracture surgery    . Above -knee to below knee popliteal bypass  novemeber 2010  . Knee surgery  10-2010  . Back surgery      Medications: Allopurinol, amlodipine,aspirin, atenolol, bupropion, clonazepam,clopidogrel, furosemide, insulin NPH, isosorbide, losartan, nitroglycerin, paroxetine, potassium chloride. Allergies:  Allergies  Allergen Reactions  . Niaspan (Niacin)   . Statins     Myalgias  . Tolectin (Tolmetin Sodium)     Family History  Problem Relation Age of Onset  . Heart attack Mother    Social History:  reports that he quit smoking about 25 years ago. He does not have any smokeless tobacco history on file. He reports that he does not drink alcohol or use illicit drugs.  ROS: All  systems are reviewed and negative except as noted.   Physical Exam:  Vital signs in last 24 hours: Temp:  [97.9 F (36.6 C)-99.2 F (37.3 C)] 97.9 F (36.6 C) (07/17 2017) Pulse Rate:  [70-96] 70 (07/17 1612) Resp:  [13-20] 19 (07/17 2017) BP: (112-130)/(55-86) 130/55 mmHg (07/17 2017) SpO2:  [92 %-96 %] 92 % (07/17 2017) Weight:  [127.279 kg (280 lb 9.6 oz)] 127.279 kg (280 lb 9.6 oz) (07/17 0500)  Cardiovascular: Skin warm; not flushed Respiratory: Breaths quiet; no shortness of breath Abdomen: No masses Neurological: Normal sensation to touch Musculoskeletal: Normal motor function arms and legs Lymphatics: No inguinal adenopathy Skin: No rashes Genitourinary:Penis is normal.  Meatus is normal.  Scrotum is normal in appearance.  Testicles are normal.  There is a GU sphincter pump in the right scrotum. Rectal: Deferred Laboratory Data:  Results for orders placed during the hospital encounter of 08/25/12 (from the past 72 hour(s))  CBC WITH DIFFERENTIAL     Status: Abnormal   Collection Time    08/25/12 11:45 AM      Result Value Range   WBC 8.0  4.0 - 10.5 K/uL   RBC 4.64  4.22 - 5.81 MIL/uL   Hemoglobin 14.5  13.0 - 17.0 g/dL   HCT 43.2  39.0 - 52.0 %   MCV 93.1  78.0 - 100.0 fL   MCH 31.3  26.0 - 34.0 pg   MCHC 33.6  30.0 - 36.0 g/dL   RDW 15.4  11.5 - 15.5 %   Platelets 161  150 - 400 K/uL   Neutrophils Relative % 83 (*) 43 - 77 %   Neutro Abs 6.6  1.7 - 7.7 K/uL   Lymphocytes Relative 10 (*) 12 - 46 %   Lymphs Abs 0.8  0.7 - 4.0 K/uL   Monocytes Relative 6  3 - 12 %   Monocytes Absolute 0.5  0.1 - 1.0 K/uL   Eosinophils Relative 1  0 - 5 %   Eosinophils Absolute 0.0  0.0 - 0.7 K/uL   Basophils Relative 0  0 - 1 %   Basophils Absolute 0.0  0.0 - 0.1 K/uL  BASIC METABOLIC PANEL     Status: Abnormal   Collection Time    08/25/12 11:45 AM      Result Value Range   Sodium 137  135 - 145 mEq/L   Potassium 4.5  3.5 - 5.1 mEq/L   Chloride 101  96 - 112 mEq/L   CO2  29  19 - 32 mEq/L   Glucose, Bld 237 (*) 70 - 99 mg/dL   BUN 20  6 - 23 mg/dL   Creatinine, Ser 1.05  0.50 - 1.35 mg/dL   Calcium 9.1  8.4 - 10.5 mg/dL   GFR calc non Af Amer 69 (*) >90 mL/min   GFR calc Af Amer 80 (*) >90 mL/min   Comment:            The eGFR has been calculated     using the CKD EPI equation.     This calculation has not been     validated in all clinical     situations.     eGFR's persistently     <90 mL/min signify     possible Chronic Kidney Disease.  TROPONIN I     Status: None   Collection Time    08/25/12 11:45 AM      Result Value Range   Troponin I <0.30  <0.30 ng/mL   Comment:            Due to the release kinetics of cTnI,     a negative result within the first hours     of the onset of symptoms does not rule out     myocardial infarction with certainty.     If myocardial infarction is still suspected,     repeat the test at appropriate intervals.  PROTIME-INR     Status: None   Collection Time    08/25/12 11:45 AM      Result Value Range   Prothrombin Time 13.3  11.6 - 15.2 seconds   INR 1.03  0.00 - 1.49  APTT     Status: None   Collection Time    08/25/12 11:45 AM      Result Value Range   aPTT 27  24 - 37 seconds  MRSA PCR SCREENING     Status: None   Collection Time    08/25/12  3:31 PM      Result Value Range   MRSA by PCR NEGATIVE  NEGATIVE   Comment:            The GeneXpert MRSA Assay (FDA     approved for NASAL specimens     only), is one component of a  comprehensive MRSA colonization     surveillance program. It is not     intended to diagnose MRSA     infection nor to guide or     monitor treatment for     MRSA infections.  GLUCOSE, CAPILLARY     Status: Abnormal   Collection Time    08/25/12  6:45 PM      Result Value Range   Glucose-Capillary 155 (*) 70 - 99 mg/dL  TROPONIN I     Status: Abnormal   Collection Time    08/25/12  7:01 PM      Result Value Range   Troponin I >20.00 (*) <0.30 ng/mL   Comment:             Due to the release kinetics of cTnI,     a negative result within the first hours     of the onset of symptoms does not rule out     myocardial infarction with certainty.     If myocardial infarction is still suspected,     repeat the test at appropriate intervals.     CRITICAL RESULT CALLED TO, READ BACK BY AND VERIFIED WITH:     Buckeye 08/25/12 WBOND  GLUCOSE, CAPILLARY     Status: Abnormal   Collection Time    08/25/12  9:52 PM      Result Value Range   Glucose-Capillary 233 (*) 70 - 99 mg/dL  LIPID PANEL     Status: Abnormal   Collection Time    08/26/12  1:00 AM      Result Value Range   Cholesterol 243 (*) 0 - 200 mg/dL   Triglycerides 690 (*) <150 mg/dL   HDL 24 (*) >39 mg/dL   Total CHOL/HDL Ratio 10.1     VLDL UNABLE TO CALCULATE IF TRIGLYCERIDE OVER 400 mg/dL  0 - 40 mg/dL   LDL Cholesterol UNABLE TO CALCULATE IF TRIGLYCERIDE OVER 400 mg/dL  0 - 99 mg/dL   Comment:            Total Cholesterol/HDL:CHD Risk     Coronary Heart Disease Risk Table                         Men   Women      1/2 Average Risk   3.4   3.3      Average Risk       5.0   4.4      2 X Average Risk   9.6   7.1      3 X Average Risk  23.4   11.0                Use the calculated Patient Ratio     above and the CHD Risk Table     to determine the patient's CHD Risk.                ATP III CLASSIFICATION (LDL):      <100     mg/dL   Optimal      100-129  mg/dL   Near or Above                        Optimal      130-159  mg/dL   Borderline      160-189  mg/dL   High      >190     mg/dL  Very High  T4, FREE     Status: None   Collection Time    08/26/12  1:00 AM      Result Value Range   Free T4 0.95  0.80 - 1.80 ng/dL  HEMOGLOBIN A1C     Status: Abnormal   Collection Time    08/26/12  1:00 AM      Result Value Range   Hemoglobin A1C 7.7 (*) <5.7 %   Comment: (NOTE)                                                                               According to the ADA  Clinical Practice Recommendations for 2011, when     HbA1c is used as a screening test:      >=6.5%   Diagnostic of Diabetes Mellitus               (if abnormal result is confirmed)     5.7-6.4%   Increased risk of developing Diabetes Mellitus     References:Diagnosis and Classification of Diabetes Mellitus,Diabetes     [id number] 1):S62-S69 and Standards of Medical Care in             Diabetes - 2011,Diabetes Care,2011,34 (Suppl 1):S11-S61.   Mean Plasma Glucose 174 (*) <117 mg/dL  MAGNESIUM     Status: None   Collection Time    08/26/12  1:00 AM      Result Value Range   Magnesium 1.7  1.5 - 2.5 mg/dL  TROPONIN I     Status: Abnormal   Collection Time    08/26/12  1:00 AM      Result Value Range   Troponin I >20.00 (*) <0.30 ng/mL   Comment: CRITICAL VALUE NOTED.  VALUE IS CONSISTENT WITH PREVIOUSLY REPORTED AND CALLED VALUE.  TSH     Status: None   Collection Time    08/26/12  1:00 AM      Result Value Range   TSH 2.483  0.350 - 4.500 uIU/mL  GLUCOSE, CAPILLARY     Status: Abnormal   Collection Time    08/26/12  7:36 AM      Result Value Range   Glucose-Capillary 241 (*) 70 - 99 mg/dL  HEPARIN LEVEL (UNFRACTIONATED)     Status: Abnormal   Collection Time    08/26/12  9:35 AM      Result Value Range   Heparin Unfractionated <0.10 (*) 0.30 - 0.70 IU/mL   Comment:            IF HEPARIN RESULTS ARE BELOW     EXPECTED VALUES, AND PATIENT     DOSAGE HAS BEEN CONFIRMED,     SUGGEST FOLLOW UP TESTING     OF ANTITHROMBIN III LEVELS.     REPEATED TO VERIFY  CBC     Status: Abnormal   Collection Time    08/26/12  9:35 AM      Result Value Range   WBC 8.7  4.0 - 10.5 K/uL   RBC 4.46  4.22 - 5.81 MIL/uL   Hemoglobin 13.9  13.0 - 17.0 g/dL   HCT 40.9  39.0 - 52.0 %   MCV 91.7  78.0 -  100.0 fL   MCH 31.2  26.0 - 34.0 pg   MCHC 34.0  30.0 - 36.0 g/dL   RDW 15.7 (*) 11.5 - 15.5 %   Platelets 179  150 - 400 K/uL  PLATELET INHIBITION P2Y12     Status: None   Collection  Time    08/26/12  9:35 AM      Result Value Range   Platelet Function  P2Y12 261  194 - 418 PRU   Comment:            The literature has shown a direct     correlation of PRU values over     230 with higher risks of     thrombotic events.  Lower PRU     values are associated with     platelet inhibition.  GLUCOSE, CAPILLARY     Status: Abnormal   Collection Time    08/26/12 11:44 AM      Result Value Range   Glucose-Capillary 250 (*) 70 - 99 mg/dL  POCT I-STAT 3, BLOOD GAS (G3+)     Status: Abnormal   Collection Time    08/26/12  2:29 PM      Result Value Range   pH, Arterial 7.446  7.350 - 7.450   pCO2 arterial 37.7  35.0 - 45.0 mmHg   pO2, Arterial 61.0 (*) 80.0 - 100.0 mmHg   Bicarbonate 26.0 (*) 20.0 - 24.0 mEq/L   TCO2 27  0 - 100 mmol/L   O2 Saturation 92.0     Acid-Base Excess 2.0  0.0 - 2.0 mmol/L   Patient temperature 98.7 F     Collection site RADIAL, ALLEN'S TEST ACCEPTABLE     Drawn by Operator     Sample type ARTERIAL    TYPE AND SCREEN     Status: None   Collection Time    08/26/12  3:00 PM      Result Value Range   ABO/RH(D) A POS     Antibody Screen NEG     Sample Expiration 08/29/2012    ABO/RH     Status: None   Collection Time    08/26/12  3:00 PM      Result Value Range   ABO/RH(D) A POS    URINALYSIS, ROUTINE W REFLEX MICROSCOPIC     Status: None   Collection Time    08/26/12  3:55 PM      Result Value Range   Color, Urine YELLOW  YELLOW   APPearance CLEAR  CLEAR   Specific Gravity, Urine 1.010  1.005 - 1.030   pH 6.0  5.0 - 8.0   Glucose, UA NEGATIVE  NEGATIVE mg/dL   Hgb urine dipstick NEGATIVE  NEGATIVE   Bilirubin Urine NEGATIVE  NEGATIVE   Ketones, ur NEGATIVE  NEGATIVE mg/dL   Protein, ur NEGATIVE  NEGATIVE mg/dL   Urobilinogen, UA 0.2  0.0 - 1.0 mg/dL   Nitrite NEGATIVE  NEGATIVE   Leukocytes, UA NEGATIVE  NEGATIVE   Comment: MICROSCOPIC NOT DONE ON URINES WITH NEGATIVE PROTEIN, BLOOD, LEUKOCYTES, NITRITE, OR GLUCOSE <1000 mg/dL.   GLUCOSE, CAPILLARY     Status: Abnormal   Collection Time    08/26/12  4:17 PM      Result Value Range   Glucose-Capillary 250 (*) 70 - 99 mg/dL  HEPARIN LEVEL (UNFRACTIONATED)     Status: Abnormal   Collection Time    08/26/12  6:00 PM      Result Value Range  Heparin Unfractionated <0.10 (*) 0.30 - 0.70 IU/mL   Comment:            IF HEPARIN RESULTS ARE BELOW     EXPECTED VALUES, AND PATIENT     DOSAGE HAS BEEN CONFIRMED,     SUGGEST FOLLOW UP TESTING     OF ANTITHROMBIN III LEVELS.   Recent Results (from the past 240 hour(s))  MRSA PCR SCREENING     Status: None   Collection Time    08/25/12  3:31 PM      Result Value Range Status   MRSA by PCR NEGATIVE  NEGATIVE Final   Comment:            The GeneXpert MRSA Assay (FDA     approved for NASAL specimens     only), is one component of a     comprehensive MRSA colonization     surveillance program. It is not     intended to diagnose MRSA     infection nor to guide or     monitor treatment for     MRSA infections.   Creatinine:  Recent Labs  08/25/12 1145  CREATININE 1.05    Xrays: See report/chart   Impression/Assessment:  S/P artificial GU sphincter  Plan: Deactivate GU sphincter.  Insert # [id number] Fr Foley catheter. Foley left to straight drainage.    Loman Logan-HENRY 08/26/2012, 9:47 PM

## 2012-08-26 NOTE — Progress Notes (Signed)
Pre-op Cardiac Surgery  Carotid Findings:  0-39% ICA stenosis.  Vertebral artery flow is antegrade.  Upper Extremity Right Left  Brachial Pressures    Radial Waveforms    Ulnar Waveforms    Palmar Arch (Allen's Test)     Findings:      Lower  Extremity Right Left  Dorsalis Pedis    Anterior Tibial    Posterior Tibial    Ankle/Brachial Indices      Findings:

## 2012-08-26 NOTE — Progress Notes (Signed)
  Echocardiogram 2D Echocardiogram has been performed.  Mackenzie Groom, [place] 08/26/2012, 10:06 AM

## 2012-08-26 NOTE — Progress Notes (Signed)
ANTICOAGULATION CONSULT NOTE - Follow Up Consult  Pharmacy Consult for Heparin Indication: chest pain/ACS, pending CABG evaluation  Allergies  Allergen Reactions  . Niaspan (Niacin)   . Statins     Myalgias  . Tolectin (Tolmetin Sodium)     Patient Measurements: Height: 5\' 6"  (167.6 cm) Weight: 280 lb 9.6 oz (127.279 kg) IBW/kg (Calculated) : 63.8 Heparin Dosing Weight: 96 kg  Vital Signs: Temp: 97.9 F (36.6 C) (07/17 0733) Temp src: Oral (07/17 0733) BP: 115/60 mmHg (07/17 0733) Pulse Rate: 73 (07/17 0733)  Labs:  Recent Labs  08/25/12 1145 08/25/12 1901 08/26/12 0100 08/26/12 0935  HGB 14.5  --   --  13.9  HCT 43.2  --   --  40.9  PLT 161  --   --  179  APTT 27  --   --   --   LABPROT 13.3  --   --   --   INR 1.03  --   --   --   HEPARINUNFRC  --   --   --  <0.10*  CREATININE 1.05  --   --   --   TROPONINI <0.30 >20.00* >20.00*  --     Estimated Creatinine Clearance: 81.4 ml/min (by C-G formula based on Cr of 1.05).   Medications:  Infusions:  . heparin 1,150 Units/hr (08/26/12 0118)  . nitroGLYCERIN 25 mcg/min (08/26/12 0118)    Assessment: NSTEMI s/p cath, for CABG eval  Anticoagulation: s/p cath 7/17, for CABG eval. Heparin resumed post-cath with undectible level this am.  P: At 0130 7/17 - start heparin at 1150 units/hr (12 units/kg/hr) 8hr HL/CBC  Cardiovascular: CAD, HTN, PVD with LE bypass, AAA repair; s/p cath with 3v CAD, pending CABG eval. VSS. Meds: Norvasc 5mg , ASA 81, Atenolol, Lasix, losartan, Magox, K+  Gastrointestinal: N/V overnight.  Endocrinology: DM. CBGs 155-241 on SSI  Fluids/Electrolytes/Nutrition: lytes ok  Nephrology: SCr 1.1  Hematology/Oncology: hgb ok, pltc 161K   Goal of Therapy:  Heparin level 0.3-0.7 units/ml Monitor platelets by anticoagulation protocol: Yes   Plan:  Increase IV heparin to 1450 units/hr and recheck level in 6 hrs.   Shontez Sermon S. Alford Highland, PharmD, [facility] Clinical Staff Pharmacist Pager  [phone]  08/26/2012, 10:52 AM

## 2012-08-26 NOTE — Consult Note (Signed)
Reason for Consult:3 vessel CAD s/p NQWMI Referring Physician: Dr. Kate Sable Max Anderson is an 72 y.o. male.  HPI: 72 yo male with known CAD presents with a cc/o CP  Max Anderson has multiple CRF including diabetes, HTN, hyperlipidemia, + family history and remote tobacco abuse. He has known CAD with prior stents going back to 2007. He also has aneurysmal PAD and has had an endograft repair of AAA and a right AK- BK popliteal bypass for an aneurysm.  He has been having CP and SOB with exertion for some time. These episodes have generally been short-lived and mild. He was not taking NTG on any regular basis. He is fairly sedentary. He had sudden onset of severe CP yesterday AM. He described this a pressure in his chest radiating to the right arm. He took 3 SL NTG over a 20 minute period without relief. His wife took him to the ED at [facility] and subsequently transferred to [facility].His initial enzymes were negative, but at 7PM yesterday evening troponin was > 20. He had cardiac catheterization which revealed severe 3 vessel CAD.   His pain improved but he had some recurrent pain this AM. He says currently he has some mild discomfort- "better than it has been since it started"  Past Medical History  Diagnosis Date  . CAD (coronary artery disease)     multiple stents to the circ and right coronary artery. 2007 [facility]  . HTN (hypertension)   . AAA (abdominal aortic aneurysm)   . Gout   . Diabetes mellitus   . Prostate cancer   . Urinary incontinence due to urethral sphincter incompetence     Past Surgical History  Procedure Laterality Date  . Coronary stent placement       He had 2.5 x 20-mm Taxus stent placed in the  his circ in August 2007   He had 2.5 x 20-mm Taxus stent placed in the   mid circ and 2.5 x 16-mm stent placed in the distal circ.  [name] Nose surgery    . Prostate surgery  1998    has urinary prosthesis for incontinence  . Appendectomy    . Cholecystectomy    . Ankle fracture  surgery    . Above -knee to below knee popliteal bypass  novemeber 2010  . Knee surgery  10-2010  . Back surgery      Family History  Problem Relation Age of Onset  . Heart attack Mother     Social History:  reports that he quit smoking about 25 years ago. He does not have any smokeless tobacco history on file. He reports that he does not drink alcohol or use illicit drugs.  Allergies:  Allergies  Allergen Reactions  . Niaspan (Niacin)   . Statins     Myalgias  . Tolectin (Tolmetin Sodium)     Medications:  Prior to Admission:  Prescriptions prior to admission  Medication Sig Dispense Refill  . allopurinol (ZYLOPRIM) 300 MG tablet Take 300 mg by mouth daily.        [name] amLODipine (NORVASC) 5 MG tablet Take 5 mg by mouth daily.      [name] aspirin 81 MG tablet Take 81 mg by mouth daily.        [name] atenolol (TENORMIN) 50 MG tablet Take 50 mg by mouth daily.      [name] buPROPion (WELLBUTRIN XL) 300 MG 24 hr tablet Take 300 mg by mouth daily.      . clonazePAM (  KLONOPIN) 0.5 MG tablet Take 0.5 mg by mouth 2 (two) times daily as needed for anxiety. Takes 1 tablet daily.      . clopidogrel (PLAVIX) 75 MG tablet Take 75 mg by mouth daily.       . furosemide (LASIX) 20 MG tablet Take 20 mg by mouth daily.      [name] HYDROcodone-acetaminophen (NORCO) 10-325 MG per tablet Take 1 tablet by mouth every 6 (six) hours as needed for pain.      [name] insulin NPH-insulin regular (HUMULIN 70/30) (70-30) 100 UNIT/ML injection Inject 110 Units into the skin 2 (two) times daily with a meal. As directed      . isosorbide mononitrate (IMDUR) 60 MG 24 hr tablet Take 60 mg by mouth daily.      [name] losartan (COZAAR) 100 MG tablet Take 100 mg by mouth daily.      . nitroGLYCERIN (NITROSTAT) 0.4 MG SL tablet Place 0.4 mg under the tongue every 5 (five) minutes as needed for chest pain.      [name] PARoxetine (PAXIL) 40 MG tablet Take 40 mg by mouth every morning.        . potassium chloride (K-DUR) 10 MEQ tablet Take 10 mEq by  mouth 2 (two) times daily.        Results for orders placed during the hospital encounter of 08/25/12 (from the past 48 hour(s))  CBC WITH DIFFERENTIAL     Status: Abnormal   Collection Time    08/25/12 11:45 AM      Result Value Range   WBC 8.0  4.0 - 10.5 K/uL   RBC 4.64  4.22 - 5.81 MIL/uL   Hemoglobin 14.5  13.0 - 17.0 g/dL   HCT 43.2  39.0 - 52.0 %   MCV 93.1  78.0 - 100.0 fL   MCH 31.3  26.0 - 34.0 pg   MCHC 33.6  30.0 - 36.0 g/dL   RDW 15.4  11.5 - 15.5 %   Platelets 161  150 - 400 K/uL   Neutrophils Relative % 83 (*) 43 - 77 %   Neutro Abs 6.6  1.7 - 7.7 K/uL   Lymphocytes Relative 10 (*) 12 - 46 %   Lymphs Abs 0.8  0.7 - 4.0 K/uL   Monocytes Relative 6  3 - 12 %   Monocytes Absolute 0.5  0.1 - 1.0 K/uL   Eosinophils Relative 1  0 - 5 %   Eosinophils Absolute 0.0  0.0 - 0.7 K/uL   Basophils Relative 0  0 - 1 %   Basophils Absolute 0.0  0.0 - 0.1 K/uL  BASIC METABOLIC PANEL     Status: Abnormal   Collection Time    08/25/12 11:45 AM      Result Value Range   Sodium 137  135 - 145 mEq/L   Potassium 4.5  3.5 - 5.1 mEq/L   Chloride 101  96 - 112 mEq/L   CO2 29  19 - 32 mEq/L   Glucose, Bld 237 (*) 70 - 99 mg/dL   BUN 20  6 - 23 mg/dL   Creatinine, Ser 1.05  0.50 - 1.35 mg/dL   Calcium 9.1  8.4 - 10.5 mg/dL   GFR calc non Af Amer 69 (*) >90 mL/min   GFR calc Af Amer 80 (*) >90 mL/min   Comment:            The eGFR has been calculated     using the CKD EPI equation.  This calculation has not been     validated in all clinical     situations.     eGFR's persistently     <90 mL/min signify     possible Chronic Kidney Disease.  TROPONIN I     Status: None   Collection Time    08/25/12 11:45 AM      Result Value Range   Troponin I <0.30  <0.30 ng/mL   Comment:            Due to the release kinetics of cTnI,     a negative result within the first hours     of the onset of symptoms does not rule out     myocardial infarction with certainty.     If myocardial  infarction is still suspected,     repeat the test at appropriate intervals.  PROTIME-INR     Status: None   Collection Time    08/25/12 11:45 AM      Result Value Range   Prothrombin Time 13.3  11.6 - 15.2 seconds   INR 1.03  0.00 - 1.49  APTT     Status: None   Collection Time    08/25/12 11:45 AM      Result Value Range   aPTT 27  24 - 37 seconds  MRSA PCR SCREENING     Status: None   Collection Time    08/25/12  3:31 PM      Result Value Range   MRSA by PCR NEGATIVE  NEGATIVE   Comment:            The GeneXpert MRSA Assay (FDA     approved for NASAL specimens     only), is one component of a     comprehensive MRSA colonization     surveillance program. It is not     intended to diagnose MRSA     infection nor to guide or     monitor treatment for     MRSA infections.  GLUCOSE, CAPILLARY     Status: Abnormal   Collection Time    08/25/12  6:45 PM      Result Value Range   Glucose-Capillary 155 (*) 70 - 99 mg/dL  TROPONIN I     Status: Abnormal   Collection Time    08/25/12  7:01 PM      Result Value Range   Troponin I >20.00 (*) <0.30 ng/mL   Comment:            Due to the release kinetics of cTnI,     a negative result within the first hours     of the onset of symptoms does not rule out     myocardial infarction with certainty.     If myocardial infarction is still suspected,     repeat the test at appropriate intervals.     CRITICAL RESULT CALLED TO, READ BACK BY AND VERIFIED WITH:     [place] 08/25/12 WBOND  GLUCOSE, CAPILLARY     Status: Abnormal   Collection Time    08/25/12  9:52 PM      Result Value Range   Glucose-Capillary 233 (*) 70 - 99 mg/dL  LIPID PANEL     Status: Abnormal   Collection Time    08/26/12  1:00 AM      Result Value Range   Cholesterol 243 (*) 0 - 200 mg/dL   Triglycerides 690 (*) <150 mg/dL   HDL  24 (*) >39 mg/dL   Total CHOL/HDL Ratio 10.1     VLDL UNABLE TO CALCULATE IF TRIGLYCERIDE OVER 400 mg/dL  0 - 40 mg/dL    LDL Cholesterol UNABLE TO CALCULATE IF TRIGLYCERIDE OVER 400 mg/dL  0 - 99 mg/dL   Comment:            Total Cholesterol/HDL:CHD Risk     Coronary Heart Disease Risk Table                         Men   Women      1/2 Average Risk   3.4   3.3      Average Risk       5.0   4.4      2 X Average Risk   9.6   7.1      3 X Average Risk  23.4   11.0                Use the calculated Patient Ratio     above and the CHD Risk Table     to determine the patient's CHD Risk.                ATP III CLASSIFICATION (LDL):      <100     mg/dL   Optimal      100-129  mg/dL   Near or Above                        Optimal      130-159  mg/dL   Borderline      160-189  mg/dL   High      >190     mg/dL   Very High  T4, FREE     Status: None   Collection Time    08/26/12  1:00 AM      Result Value Range   Free T4 0.95  0.80 - 1.80 ng/dL  MAGNESIUM     Status: None   Collection Time    08/26/12  1:00 AM      Result Value Range   Magnesium 1.7  1.5 - 2.5 mg/dL  TROPONIN I     Status: Abnormal   Collection Time    08/26/12  1:00 AM      Result Value Range   Troponin I >20.00 (*) <0.30 ng/mL   Comment: CRITICAL VALUE NOTED.  VALUE IS CONSISTENT WITH PREVIOUSLY REPORTED AND CALLED VALUE.  TSH     Status: None   Collection Time    08/26/12  1:00 AM      Result Value Range   TSH 2.483  0.350 - 4.500 uIU/mL  GLUCOSE, CAPILLARY     Status: Abnormal   Collection Time    08/26/12  7:36 AM      Result Value Range   Glucose-Capillary 241 (*) 70 - 99 mg/dL  HEPARIN LEVEL (UNFRACTIONATED)     Status: Abnormal   Collection Time    08/26/12  9:35 AM      Result Value Range   Heparin Unfractionated <0.10 (*) 0.30 - 0.70 IU/mL   Comment:            IF HEPARIN RESULTS ARE BELOW     EXPECTED VALUES, AND PATIENT     DOSAGE HAS BEEN CONFIRMED,     SUGGEST FOLLOW UP TESTING     OF ANTITHROMBIN III LEVELS.  REPEATED TO VERIFY  CBC     Status: Abnormal   Collection Time    08/26/12  9:35 AM       Result Value Range   WBC 8.7  4.0 - 10.5 K/uL   RBC 4.46  4.22 - 5.81 MIL/uL   Hemoglobin 13.9  13.0 - 17.0 g/dL   HCT 40.9  39.0 - 52.0 %   MCV 91.7  78.0 - 100.0 fL   MCH 31.2  26.0 - 34.0 pg   MCHC 34.0  30.0 - 36.0 g/dL   RDW 15.7 (*) 11.5 - 15.5 %   Platelets 179  150 - 400 K/uL  PLATELET INHIBITION P2Y12     Status: None   Collection Time    08/26/12  9:35 AM      Result Value Range   Platelet Function  P2Y12 261  194 - 418 PRU   Comment:            The literature has shown a direct     correlation of PRU values over     230 with higher risks of     thrombotic events.  Lower PRU     values are associated with     platelet inhibition.    Dg Chest Port 1 View  08/25/2012   *RADIOLOGY REPORT*  Clinical Data: Chest pain.  PORTABLE CHEST - 1 VIEW  Comparison: 10/22/2010  Findings: Lung volumes are very low bilaterally.  Heart size is within normal limits.  There is no evidence of overt edema, airspace consolidation or pleural fluid.  IMPRESSION: Low lung volumes.   Original Report Authenticated By: Aletta Edouard, M.D.    Review of Systems  Constitutional: Positive for malaise/fatigue.  Respiratory: Positive for shortness of breath (with exertion). Negative for cough and wheezing.   Cardiovascular: Positive for chest pain and orthopnea. Negative for palpitations.       Prior AAA- s/p stent graft Right fem - pop for popliteal aneurysm  Genitourinary:       Incontinence post prostatectomy Has prosthetic urinary sphincter  Musculoskeletal: Positive for back pain (prior back surgery).  All other systems reviewed and are negative.   Blood pressure 124/85, pulse 96, temperature 98.3 F (36.8 C), temperature source Oral, resp. rate 17, height 5\' 6"  (1.676 m), weight 280 lb 9.6 oz (127.279 kg), SpO2 95.00%. Physical Exam  Vitals reviewed. Constitutional: He is oriented to person, place, and time. No distress.  Morbidly obese  HENT:  Head: Normocephalic and atraumatic.  Eyes:  EOM are normal. Pupils are equal, round, and reactive to light.  Neck: Neck supple. No thyromegaly present.  No carotid bruit  Cardiovascular: Normal rate, regular rhythm and normal heart sounds.  Exam reveals no gallop and no friction rub.   No murmur heard. Respiratory: Effort normal and breath sounds normal. He has no wheezes. He has no rales.  GI: Soft. There is no tenderness.  Musculoskeletal: He exhibits no edema.  Well healed surgical scar right leg   Lymphadenopathy:    He has no cervical adenopathy.  Neurological: He is alert and oriented to person, place, and time. No cranial nerve deficit.  Motor intact  Skin: Skin is warm and dry.   CARDIAC CATHETERIZATION Procedural Findings:  Hemodynamics:  AO: 147/86 mmHg  LV: 146/18 mmHg  LVEDP: 32 mmHg  Coronary angiography:  Coronary dominance: Right  Left Main: Large in size with no significant disease.  Left Anterior Descending (LAD): Normal in size with an 80-90% proximal stenosis  followed by a large aneurysmal segment which is followed by a 20% stenosis. In the midsegment, there is 40% tubular stenosis inside the previously placed stent. The distal LAD has minor irregularities.  1st diagonal (D1): Small in size with minor irregularities.  2nd diagonal (D2): Normal in size with 20% ostial stenosis.  3rd diagonal (D3): Normal in size with minor irregularities.  Circumflex (LCx): Normal in size and nondominant. The vessel is occluded in the midsegment at the site of the previously placed stent.  1st obtuse marginal: Small in size with no significant disease.  2nd obtuse marginal: Normal in size with 95% ostial stenosis. The distal vessel is underfilling but there might be diffuse 60% disease in the midsegment.  Right Coronary Artery: Large in size and dominant. A stent is noted proximally with mild 20% in-stent restenosis. There is diffuse 30% disease in the midsegment. There is 95% stenosis distally just between 2 aneurysmal  segments.  Posterior descending artery: Large in size with minor irregularities.  Posterior AV segment: Normal in size with no significant disease.  Posterolateral branchs: Large in size with no significant disease. These gets faint collaterals to the distal left circumflex distribution. Left ventriculography: Left ventricular systolic function is normal , LVEF is estimated at 55 %, there is no significant mitral regurgitation  Final Conclusions:  1. Significant three-vessel coronary artery disease with large aneurysmal segment in the proximal LAD as well as aneurysmal segments in RCA.  2. Normal LV systolic function.  3. Moderately to severely elevated left ventricular end-diastolic pressure.    P2Y12= 261   Assessment/Plan: 72 yo male with multiple CRF and known CAD presents with a NQWMI. At catheterization he has severe 3 vessel CAD. CABG is indicated for survival benefit and relief of symptoms.   He has been on plavix but his P2Y12 shows adequate platelet function to safely proceed. Given that he has continued to have CP I think it is in his best interests to proceed with CABG tomorrow.  I discussed the general nature of the procedure, need for general anesthesia, incisions to be used with Mr. & Mrs. Anderson. We discussed the expected hospital stay, overall recovery and short and long term outcomes. We discussed the indications, risks, benefits and alternatives. They understand the risks include, but are not limited to, death, stroke, MI, DVT/PE, bleeding, possible need for transfusion, infections, cardiac arrhythmias, and other organ system dysfunction including respiratory, renal, or GI complications. They understand the possibility of unforeseeable complications.   He accepts the risks and agrees to proceed.  For OR in AM  He has a urethral sphincter prosthesis. We will ask Urology to see and assist with Foley placement   Max Anderson C 08/26/2012, 12:12 PM

## 2012-08-26 NOTE — Progress Notes (Signed)
ANTICOAGULATION CONSULT NOTE - Follow Up Consult  Pharmacy Consult for Heparin Indication: chest pain/ACS, awaiting CABG  Allergies  Allergen Reactions  . Niaspan (Niacin)   . Statins     Myalgias  . Tolectin (Tolmetin Sodium)     Patient Measurements: Height: 5\' 6"  (167.6 cm) Weight: 280 lb 9.6 oz (127.279 kg) IBW/kg (Calculated) : 63.8 Heparin Dosing Weight: 96 kg  Vital Signs: Temp: 98.7 F (37.1 C) (07/17 1612) Temp src: Oral (07/17 1612) BP: 117/55 mmHg (07/17 1612) Pulse Rate: 70 (07/17 1612)  Labs:  Recent Labs  08/25/12 1145 08/25/12 1901 08/26/12 0100 08/26/12 0935 08/26/12 1800  HGB 14.5  --   --  13.9  --   HCT 43.2  --   --  40.9  --   PLT 161  --   --  179  --   APTT 27  --   --   --   --   LABPROT 13.3  --   --   --   --   INR 1.03  --   --   --   --   HEPARINUNFRC  --   --   --  <0.10* <0.10*  CREATININE 1.05  --   --   --   --   TROPONINI <0.30 >20.00* >20.00*  --   --     Estimated Creatinine Clearance: 81.4 ml/min (by C-G formula based on Cr of 1.05).  Assessment: 72 year old male on heparin with plans for CABG in am 7/18.  No problems with infusion or bleeding noted per RN.  Goal of Therapy:  Heparin level 0.3-0.7 units/ml Monitor platelets by anticoagulation protocol: Yes   Plan:  - Increase heparin drip to 1150 units/hr - Check 6h heparin level after rate change - No further levels with plans to d/c heparin 7/18 at 0400 for CABG  Esmirna Ravan L. Amada Jupiter, PharmD, [place] Clinical Pharmacist Pager: [phone] Pharmacy: [phone] 08/26/2012 6:45 PM

## 2012-08-26 NOTE — Progress Notes (Signed)
Spoke with pt at length regarding CABG procedure. Pt refuses to watch videos at this time but given CVTS booklet of information. Pt states he will look over it when he gets the opportunity.

## 2012-08-26 NOTE — Progress Notes (Signed)
Patient: Max Anderson / Admit Date: 08/25/2012 / Date of Encounter: 08/26/2012, 6:52 AM   Subjective  Was sleeping when I initially walked in, but easily awakened. C/o 3/10 chest discomfort, improved from admission but still present. Also has had nausea and heaving overnight.   Objective   Telemetry: NSR Physical Exam: Filed Vitals:   08/26/12 0400  BP: 113/68  Pulse: 64  Temp: 98.1 F (36.7 C)  Resp: 18   General: Well developed obese WM in no acute distress. Head: Normocephalic, atraumatic, sclera non-icteric, no xanthomas, nares are without discharge. Neck:JVD not elevated. Lungs: Clear bilaterally to auscultation without wheezes, rales, or rhonchi. Breathing is unlabored. Heart: RRR distant heart sounds S1 S2 without murmurs, rubs, or gallops.  Abdomen: Soft, non-tender, non-distended with normoactive bowel sounds. No hepatomegaly. No rebound/guarding. No obvious abdominal masses. Msk:  Strength and tone appear normal for age. Extremities: No clubbing or cyanosis. No edema.  Distal pedal pulses are 2+ and equal bilaterally. Neuro: Alert and oriented X 3. Moves all extremities spontaneously. Psych:  Responds to questions appropriately with a normal affect.    Intake/Output Summary (Last 24 hours) at 08/26/12 [id number] Last data filed at 08/26/12 0500  Gross per 24 hour  Intake 738.05 ml  Output   1500 ml  Net -761.95 ml    Inpatient Medications:  . allopurinol  300 mg Oral Daily  . amLODipine  5 mg Oral Daily  . aspirin  324 mg Oral Once  . aspirin EC  81 mg Oral Daily  . atenolol  50 mg Oral Daily  . buPROPion  300 mg Oral Daily  . furosemide  40 mg Oral BID  . insulin aspart  0-15 Units Subcutaneous TID WC  . isosorbide mononitrate  60 mg Oral Daily  . losartan  100 mg Oral Daily  . PARoxetine  40 mg Oral Q0600  . potassium chloride  10 mEq Oral BID  . sodium chloride  3 mL Intravenous Q12H    Labs:  Recent Labs  08/25/12 1145 08/26/12 0100  NA 137  --     K 4.5  --   CL 101  --   CO2 29  --   GLUCOSE 237*  --   BUN 20  --   CREATININE 1.05  --   CALCIUM 9.1  --   MG  --  1.7    Recent Labs  08/25/12 1145  WBC 8.0  NEUTROABS 6.6  HGB 14.5  HCT 43.2  MCV 93.1  PLT 161    Recent Labs  08/25/12 1145 08/25/12 1901 08/26/12 0100  TROPONINI <0.30 >20.00* >20.00*     Radiology/Studies:  Dg Chest Port 1 View 08/25/2012   *RADIOLOGY REPORT*  Clinical Data: Chest pain.  PORTABLE CHEST - 1 VIEW  Comparison: 10/22/2010  Findings: Lung volumes are very low bilaterally.  Heart size is within normal limits.  There is no evidence of overt edema, airspace consolidation or pleural fluid.  IMPRESSION: Low lung volumes.   Original Report Authenticated By: Aletta Edouard, M.D.     Assessment and Plan  1. NSTEMI troponin >20 with 3V CAD, CABG recommended 2. PVD including prior LE bypass, AAA repair, stable by [facility] eval 07/2012 3. HTN, controlled 4. Diabetes mellitus, on SSI 5. Hypertriglyceridemia  Pain free now. Concerned with troponin elevation ECG this am with no acute ST elevation or changes Will do bedside echo to reassess EF.  Check P2Y to see wear we stand in regard to platelet activity  Will likely not have CABG until  Monday CVTS called.  Long discussion with wife and patient about obesity and need for cardiac rehab post op  Jenkins Rouge 8:56 AM

## 2012-08-26 NOTE — Progress Notes (Signed)
Pre-op Cardiac Surgery  Carotid Findings:  0-39% ICA stenosis.  Vertebral artery flow is antegrade.  Upper Extremity Right Left  Brachial Pressures IV triphasic 115 triphasic  Radial Waveforms triphasic triphasic  Ulnar Waveforms triphasic triphasic  Palmar Arch (Allen's Test) WNL WNL   Findings:  Doppler waveforms remain normal with ulnar and radial compressions bilaterally.    Lower  Extremity Right Left  Dorsalis Pedis    Anterior Tibial 114 monophasic 128 triphasic  Posterior Tibial 145 triphasic 135 triphasic  Ankle/Brachial Indices >1.0 >1.0    Findings:  ABI is within normal limits bilaterally with abnormal Doppler waveforms noted in the right anterior tibial artery.

## 2012-08-27 ENCOUNTER — Encounter (HOSPITAL_COMMUNITY): Payer: Medicare Other

## 2012-08-27 ENCOUNTER — Encounter (HOSPITAL_COMMUNITY)
Admission: EM | Disposition: A | Discharge status: Home Health | Payer: Self-pay | Source: Home / Self Care | Attending: Thoracic Surgery (Cardiothoracic Vascular Surgery)

## 2012-08-27 ENCOUNTER — Encounter (HOSPITAL_COMMUNITY): Payer: Self-pay | Admitting: Anesthesiology

## 2012-08-27 ENCOUNTER — Inpatient Hospital Stay (HOSPITAL_COMMUNITY): Payer: Medicare Other

## 2012-08-27 ENCOUNTER — Inpatient Hospital Stay (HOSPITAL_COMMUNITY): Payer: Medicare Other | Admitting: Anesthesiology

## 2012-08-27 DIAGNOSIS — I2510 Atherosclerotic heart disease of native coronary artery without angina pectoris: Secondary | ICD-10-CM

## 2012-08-27 HISTORY — PX: CORONARY ARTERY BYPASS GRAFT: SHX141

## 2012-08-27 HISTORY — PX: TEE WITHOUT CARDIOVERSION: SHX5443

## 2012-08-27 LAB — POCT I-STAT 3, ART BLOOD GAS (G3+)
Acid-base deficit: 3 mmol/L — ABNORMAL HIGH (ref 0.0–2.0)
Bicarbonate: 23.5 mEq/L (ref 20.0–24.0)
O2 Saturation: 92 %
O2 Saturation: 89 %
Patient temperature: 37.3
TCO2: 24 mmol/L (ref 0–100)
pCO2 arterial: 46 mmHg — ABNORMAL HIGH (ref 35.0–45.0)
pCO2 arterial: 42.2 mmHg (ref 35.0–45.0)
pH, Arterial: 7.29 — ABNORMAL LOW (ref 7.350–7.450)
pO2, Arterial: 68 mmHg — ABNORMAL LOW (ref 80.0–100.0)
pO2, Arterial: 62 mmHg — ABNORMAL LOW (ref 80.0–100.0)

## 2012-08-27 LAB — CBC
HCT: 31.7 % — ABNORMAL LOW (ref 39.0–52.0)
HCT: 30 % — ABNORMAL LOW (ref 39.0–52.0)
Hemoglobin: 12.9 g/dL — ABNORMAL LOW (ref 13.0–17.0)
Hemoglobin: 10.1 g/dL — ABNORMAL LOW (ref 13.0–17.0)
MCH: 30.3 pg (ref 26.0–34.0)
MCH: 30.7 pg (ref 26.0–34.0)
MCHC: 33.4 g/dL (ref 30.0–36.0)
MCV: 91.9 fL (ref 78.0–100.0)
Platelets: 160 10*3/uL (ref 150–400)
Platelets: 160 10*3/uL (ref 150–400)
RBC: 4.26 MIL/uL (ref 4.22–5.81)
RDW: 15.7 % — ABNORMAL HIGH (ref 11.5–15.5)
RDW: 15.6 % — ABNORMAL HIGH (ref 11.5–15.5)
WBC: 9.5 10*3/uL (ref 4.0–10.5)
WBC: 14.7 10*3/uL — ABNORMAL HIGH (ref 4.0–10.5)

## 2012-08-27 LAB — POCT I-STAT 4, (NA,K, GLUC, HGB,HCT)
Glucose, Bld: 231 mg/dL — ABNORMAL HIGH (ref 70–99)
Potassium: 3.9 mEq/L (ref 3.5–5.1)

## 2012-08-27 LAB — BASIC METABOLIC PANEL
CO2: 28 mEq/L (ref 19–32)
Chloride: 97 mEq/L (ref 96–112)
Creatinine, Ser: 1.13 mg/dL (ref 0.50–1.35)
GFR calc Af Amer: 74 mL/min — ABNORMAL LOW (ref >90)
Sodium: 133 mEq/L — ABNORMAL LOW (ref 135–145)

## 2012-08-27 LAB — APTT: aPTT: 37 seconds (ref 24–37)

## 2012-08-27 LAB — POCT I-STAT, CHEM 8
HCT: 30 % — ABNORMAL LOW (ref 39.0–52.0)
Hemoglobin: 10.2 g/dL — ABNORMAL LOW (ref 13.0–17.0)
Potassium: 5 mEq/L (ref 3.5–5.1)
Sodium: 138 mEq/L (ref 135–145)
TCO2: 24 mmol/L (ref 0–100)

## 2012-08-27 LAB — HEPARIN LEVEL (UNFRACTIONATED): Heparin Unfractionated: 0.2 IU/mL — ABNORMAL LOW (ref 0.30–0.70)

## 2012-08-27 LAB — MAGNESIUM
Magnesium: 1.8 mg/dL (ref 1.5–2.5)
Magnesium: 2.8 mg/dL — ABNORMAL HIGH (ref 1.5–2.5)

## 2012-08-27 LAB — CREATININE, SERUM
Creatinine, Ser: 1.24 mg/dL (ref 0.50–1.35)
GFR calc non Af Amer: 57 mL/min — ABNORMAL LOW (ref >90)

## 2012-08-27 LAB — HEMOGLOBIN AND HEMATOCRIT, BLOOD: HCT: 27.2 % — ABNORMAL LOW (ref 39.0–52.0)

## 2012-08-27 SURGERY — CORONARY ARTERY BYPASS GRAFTING (CABG)
Anesthesia: General | Site: Chest | Wound class: Clean

## 2012-08-27 MED ORDER — ALBUMIN HUMAN 5 % IV SOLN
12.5000 g | Freq: Once | INTRAVENOUS | Status: AC
  Administered 2012-08-27: 12.5 g via INTRAVENOUS

## 2012-08-27 MED ORDER — METOPROLOL TARTRATE 12.5 MG HALF TABLET
12.5000 mg | ORAL_TABLET | Freq: Two times a day (BID) | ORAL | Status: DC
  Administered 2012-08-28: 12.5 mg via ORAL
  Filled 2012-08-27 (×5): qty 1

## 2012-08-27 MED ORDER — METOPROLOL TARTRATE 1 MG/ML IV SOLN
2.5000 mg | INTRAVENOUS | Status: DC | PRN
  Administered 2012-08-30: 5 mg via INTRAVENOUS

## 2012-08-27 MED ORDER — SUCCINYLCHOLINE CHLORIDE 20 MG/ML IJ SOLN
INTRAMUSCULAR | Status: DC | PRN
  Administered 2012-08-27: 140 mg via INTRAVENOUS

## 2012-08-27 MED ORDER — LACTATED RINGERS IV SOLN
INTRAVENOUS | Status: DC | PRN
  Administered 2012-08-27: 07:00:00 via INTRAVENOUS

## 2012-08-27 MED ORDER — ARTIFICIAL TEARS OP OINT
TOPICAL_OINTMENT | OPHTHALMIC | Status: DC | PRN
  Administered 2012-08-27: 1 via OPHTHALMIC

## 2012-08-27 MED ORDER — MAGNESIUM SULFATE 40 MG/ML IJ SOLN: 4.0000 g | Freq: Once | INTRAMUSCULAR | Status: AC

## 2012-08-27 MED ORDER — FENTANYL CITRATE 0.05 MG/ML IJ SOLN
INTRAMUSCULAR | Status: DC | PRN
  Administered 2012-08-27 (×2): 250 ug via INTRAVENOUS
  Administered 2012-08-27: 100 ug via INTRAVENOUS
  Administered 2012-08-27: 250 ug via INTRAVENOUS
  Administered 2012-08-27: 750 ug via INTRAVENOUS
  Administered 2012-08-27: 250 ug via INTRAVENOUS
  Administered 2012-08-27: 150 ug via INTRAVENOUS

## 2012-08-27 MED ORDER — VECURONIUM BROMIDE 10 MG IV SOLR
INTRAVENOUS | Status: DC | PRN
  Administered 2012-08-27: 5 mg via INTRAVENOUS
  Administered 2012-08-27: 10 mg via INTRAVENOUS
  Administered 2012-08-27: 5 mg via INTRAVENOUS

## 2012-08-27 MED ORDER — PHENYLEPHRINE HCL 10 MG/ML IJ SOLN
30.0000 ug/min | Freq: Once | INTRAVENOUS | Status: AC
  Administered 2012-08-27: 30 ug/min via INTRAVENOUS
  Filled 2012-08-27: qty 2

## 2012-08-27 MED ORDER — ACETAMINOPHEN 500 MG PO TABS
1000.0000 mg | ORAL_TABLET | Freq: Four times a day (QID) | ORAL | Status: AC
  Administered 2012-08-28 – 2012-09-01 (×15): 1000 mg via ORAL
  Filled 2012-08-27 (×18): qty 2

## 2012-08-27 MED ORDER — ASPIRIN 81 MG PO CHEW
324.0000 mg | CHEWABLE_TABLET | Freq: Every day | ORAL | Status: DC
  Filled 2012-08-27: qty 2

## 2012-08-27 MED ORDER — ACETAMINOPHEN 10 MG/ML IV SOLN
1000.0000 mg | Freq: Once | INTRAVENOUS | Status: AC
  Administered 2012-08-27: 1000 mg via INTRAVENOUS
  Filled 2012-08-27: qty 100

## 2012-08-27 MED ORDER — HEPARIN SODIUM (PORCINE) 1000 UNIT/ML IJ SOLN
INTRAMUSCULAR | Status: DC | PRN
  Administered 2012-08-27: 2000 [IU] via INTRAVENOUS
  Administered 2012-08-27: 34000 [IU] via INTRAVENOUS

## 2012-08-27 MED ORDER — OXYCODONE HCL 5 MG PO TABS
5.0000 mg | ORAL_TABLET | ORAL | Status: DC | PRN
  Administered 2012-08-28 – 2012-09-02 (×14): 10 mg via ORAL
  Filled 2012-08-27 (×16): qty 2

## 2012-08-27 MED ORDER — MORPHINE SULFATE 2 MG/ML IJ SOLN
1.0000 mg | INTRAMUSCULAR | Status: AC | PRN
  Administered 2012-08-27: 2 mg via INTRAVENOUS
  Filled 2012-08-27 (×3): qty 1

## 2012-08-27 MED ORDER — CALCIUM CHLORIDE 10 % IV SOLN
INTRAVENOUS | Status: DC | PRN
  Administered 2012-08-27: 1 g via INTRAVENOUS

## 2012-08-27 MED ORDER — DOPAMINE-DEXTROSE 3.2-5 MG/ML-% IV SOLN
2.0000 ug/kg/min | INTRAVENOUS | Status: DC
  Administered 2012-08-27: 3 ug/kg/min via INTRAVENOUS
  Administered 2012-08-28: 4 ug/kg/min via INTRAVENOUS
  Filled 2012-08-27: qty 250

## 2012-08-27 MED ORDER — DEXMEDETOMIDINE HCL IN NACL 200 MCG/50ML IV SOLN
0.1000 ug/kg/h | INTRAVENOUS | Status: DC
  Administered 2012-08-28: 0.5 ug/kg/h via INTRAVENOUS
  Administered 2012-08-28: 0.3 ug/kg/h via INTRAVENOUS
  Filled 2012-08-27 (×2): qty 50

## 2012-08-27 MED ORDER — LACTATED RINGERS IV SOLN: 500.0000 mL | Freq: Once | INTRAVENOUS | Status: AC | PRN

## 2012-08-27 MED ORDER — SODIUM CHLORIDE 0.9 % IJ SOLN
3.0000 mL | Freq: Two times a day (BID) | INTRAMUSCULAR | Status: DC
  Administered 2012-08-28 – 2012-09-02 (×9): 3 mL via INTRAVENOUS

## 2012-08-27 MED ORDER — ALBUMIN HUMAN 5 % IV SOLN
INTRAVENOUS | Status: DC | PRN
  Administered 2012-08-27 (×2): via INTRAVENOUS

## 2012-08-27 MED ORDER — ETOMIDATE 2 MG/ML IV SOLN
INTRAVENOUS | Status: DC | PRN
  Administered 2012-08-27: 18 mg via INTRAVENOUS

## 2012-08-27 MED ORDER — VANCOMYCIN HCL IN DEXTROSE 1-5 GM/200ML-% IV SOLN
1000.0000 mg | Freq: Once | INTRAVENOUS | Status: AC
  Administered 2012-08-27: 1000 mg via INTRAVENOUS
  Filled 2012-08-27: qty 200

## 2012-08-27 MED ORDER — SODIUM CHLORIDE 0.9 % IV SOLN
INTRAVENOUS | Status: DC | PRN
  Administered 2012-08-27: 08:00:00 via INTRAVENOUS

## 2012-08-27 MED ORDER — MAGNESIUM SULFATE 40 MG/ML IJ SOLN
INTRAMUSCULAR | Status: AC
  Administered 2012-08-27: 4 g via INTRAVENOUS
  Filled 2012-08-27: qty 100

## 2012-08-27 MED ORDER — DEXMEDETOMIDINE HCL IN NACL 200 MCG/50ML IV SOLN
0.4000 ug/kg/h | INTRAVENOUS | Status: DC
  Filled 2012-08-27: qty 50

## 2012-08-27 MED ORDER — PANTOPRAZOLE SODIUM 40 MG PO TBEC
40.0000 mg | DELAYED_RELEASE_TABLET | Freq: Every day | ORAL | Status: DC
  Administered 2012-08-29 – 2012-09-02 (×5): 40 mg via ORAL
  Filled 2012-08-27 (×5): qty 1

## 2012-08-27 MED ORDER — DEXTROSE 5 % IV SOLN
1.5000 g | Freq: Two times a day (BID) | INTRAVENOUS | Status: AC
  Administered 2012-08-27 – 2012-08-29 (×4): 1.5 g via INTRAVENOUS
  Filled 2012-08-27 (×4): qty 1.5

## 2012-08-27 MED ORDER — CALCIUM CHLORIDE 10 % IV SOLN
1.0000 g | Freq: Once | INTRAVENOUS | Status: AC
  Administered 2012-08-27: 1 g via INTRAVENOUS

## 2012-08-27 MED ORDER — ALBUMIN HUMAN 5 % IV SOLN
250.0000 mL | INTRAVENOUS | Status: AC | PRN
  Administered 2012-08-27 (×3): 250 mL via INTRAVENOUS

## 2012-08-27 MED ORDER — BISACODYL 5 MG PO TBEC
10.0000 mg | DELAYED_RELEASE_TABLET | Freq: Every day | ORAL | Status: DC
  Administered 2012-08-29 – 2012-09-02 (×5): 10 mg via ORAL
  Filled 2012-08-27 (×2): qty 1
  Filled 2012-08-27 (×4): qty 2

## 2012-08-27 MED ORDER — DEXTROSE 5 % IV SOLN
INTRAVENOUS | Status: DC | PRN
  Administered 2012-08-27: 08:00:00 via INTRAVENOUS

## 2012-08-27 MED ORDER — METOPROLOL TARTRATE 25 MG/10 ML ORAL SUSPENSION
12.5000 mg | Freq: Two times a day (BID) | ORAL | Status: DC
  Filled 2012-08-27 (×5): qty 5

## 2012-08-27 MED ORDER — LIDOCAINE HCL (CARDIAC) 20 MG/ML IV SOLN
INTRAVENOUS | Status: DC | PRN
  Administered 2012-08-27: 100 mg via INTRAVENOUS

## 2012-08-27 MED ORDER — ASPIRIN EC 325 MG PO TBEC
325.0000 mg | DELAYED_RELEASE_TABLET | Freq: Every day | ORAL | Status: DC
  Administered 2012-08-29 – 2012-09-02 (×5): 325 mg via ORAL
  Filled 2012-08-27 (×6): qty 1

## 2012-08-27 MED ORDER — MORPHINE SULFATE 2 MG/ML IJ SOLN
2.0000 mg | INTRAMUSCULAR | Status: DC | PRN
  Administered 2012-08-28 (×2): 2 mg via INTRAVENOUS
  Administered 2012-08-28 (×2): 4 mg via INTRAVENOUS
  Administered 2012-08-28: 2 mg via INTRAVENOUS
  Administered 2012-08-28 (×3): 4 mg via INTRAVENOUS
  Administered 2012-08-28: 2 mg via INTRAVENOUS
  Administered 2012-08-29 (×2): 4 mg via INTRAVENOUS
  Filled 2012-08-27 (×2): qty 2
  Filled 2012-08-27: qty 1
  Filled 2012-08-27 (×5): qty 2
  Filled 2012-08-27: qty 1
  Filled 2012-08-27: qty 2
  Filled 2012-08-27: qty 1

## 2012-08-27 MED ORDER — ROCURONIUM BROMIDE 100 MG/10ML IV SOLN
INTRAVENOUS | Status: DC | PRN
  Administered 2012-08-27: 50 mg via INTRAVENOUS

## 2012-08-27 MED ORDER — SODIUM CHLORIDE 0.45 % IV SOLN
INTRAVENOUS | Status: DC
  Administered 2012-08-27: 14:00:00 via INTRAVENOUS

## 2012-08-27 MED ORDER — SODIUM CHLORIDE 0.9 % IV SOLN
INTRAVENOUS | Status: DC
  Administered 2012-08-27: 14:00:00 via INTRAVENOUS

## 2012-08-27 MED ORDER — SODIUM CHLORIDE 0.9 % IV SOLN
250.0000 mL | INTRAVENOUS | Status: DC
  Administered 2012-08-28: 250 mL via INTRAVENOUS

## 2012-08-27 MED ORDER — ONDANSETRON HCL 4 MG/2ML IJ SOLN: 4.0000 mg | Freq: Four times a day (QID) | INTRAMUSCULAR | Status: DC | PRN

## 2012-08-27 MED ORDER — PROPOFOL 10 MG/ML IV BOLUS
INTRAVENOUS | Status: DC | PRN
  Administered 2012-08-27: 100 mg via INTRAVENOUS
  Administered 2012-08-27 (×2): 50 mg via INTRAVENOUS

## 2012-08-27 MED ORDER — ALBUMIN HUMAN 5 % IV SOLN
INTRAVENOUS | Status: AC
  Administered 2012-08-27: 250 mL via INTRAVENOUS
  Filled 2012-08-27: qty 750

## 2012-08-27 MED ORDER — MIDAZOLAM HCL 5 MG/5ML IJ SOLN
INTRAMUSCULAR | Status: DC | PRN
  Administered 2012-08-27: 4 mg via INTRAVENOUS
  Administered 2012-08-27: 2 mg via INTRAVENOUS
  Administered 2012-08-27: 4 mg via INTRAVENOUS
  Administered 2012-08-27: 3 mg via INTRAVENOUS
  Administered 2012-08-27: 5 mg via INTRAVENOUS
  Administered 2012-08-27: 2 mg via INTRAVENOUS

## 2012-08-27 MED ORDER — BISACODYL 10 MG RE SUPP: 10.0000 mg | Freq: Every day | RECTAL | Status: DC

## 2012-08-27 MED ORDER — DOCUSATE SODIUM 100 MG PO CAPS
200.0000 mg | ORAL_CAPSULE | Freq: Every day | ORAL | Status: DC
  Administered 2012-08-29 – 2012-09-02 (×5): 200 mg via ORAL
  Filled 2012-08-27 (×5): qty 2

## 2012-08-27 MED ORDER — MIDAZOLAM HCL 2 MG/2ML IJ SOLN
2.0000 mg | INTRAMUSCULAR | Status: DC | PRN
  Administered 2012-08-28 (×2): 2 mg via INTRAVENOUS
  Filled 2012-08-27 (×2): qty 2

## 2012-08-27 MED ORDER — DEXMEDETOMIDINE HCL IN NACL 400 MCG/100ML IV SOLN
0.4000 ug/kg/h | INTRAVENOUS | Status: AC
  Administered 2012-08-27: 0.502 ug/kg/h via INTRAVENOUS
  Filled 2012-08-27 (×2): qty 100

## 2012-08-27 MED ORDER — LACTATED RINGERS IV SOLN
INTRAVENOUS | Status: DC
  Administered 2012-08-27: 14:00:00 via INTRAVENOUS

## 2012-08-27 MED ORDER — SODIUM CHLORIDE 0.9 % IJ SOLN
OROMUCOSAL | Status: DC | PRN
  Administered 2012-08-27 (×3): via TOPICAL

## 2012-08-27 MED ORDER — POTASSIUM CHLORIDE 10 MEQ/50ML IV SOLN
10.0000 meq | INTRAVENOUS | Status: AC
  Administered 2012-08-27 (×3): 10 meq via INTRAVENOUS

## 2012-08-27 MED ORDER — SODIUM CHLORIDE 0.9 % IJ SOLN: 3.0000 mL | INTRAMUSCULAR | Status: DC | PRN

## 2012-08-27 MED ORDER — PHENYLEPHRINE HCL 10 MG/ML IJ SOLN
0.0000 ug/min | INTRAMUSCULAR | Status: DC
  Administered 2012-08-27: 20 ug/min via INTRAVENOUS
  Administered 2012-08-27: 75 ug/min via INTRAVENOUS
  Administered 2012-08-28: 60 ug/min via INTRAVENOUS
  Administered 2012-08-28: 45.067 ug/min via INTRAVENOUS
  Administered 2012-08-28: 20 ug/min via INTRAVENOUS
  Filled 2012-08-27 (×4): qty 2

## 2012-08-27 MED ORDER — SODIUM CHLORIDE 0.9 % IV SOLN
INTRAVENOUS | Status: DC
  Administered 2012-08-27: 13.4 [IU]/h via INTRAVENOUS
  Administered 2012-08-27: 12 [IU]/h via INTRAVENOUS
  Administered 2012-08-28: 10.7 [IU]/h via INTRAVENOUS
  Administered 2012-08-28: 10 [IU]/h via INTRAVENOUS
  Filled 2012-08-27 (×5): qty 1

## 2012-08-27 MED ORDER — HEMOSTATIC AGENTS (NO CHARGE) OPTIME
TOPICAL | Status: DC | PRN
  Administered 2012-08-27: 1 via TOPICAL

## 2012-08-27 MED ORDER — 0.9 % SODIUM CHLORIDE (POUR BTL) OPTIME
TOPICAL | Status: DC | PRN
  Administered 2012-08-27: 5000 mL

## 2012-08-27 MED ORDER — PROTAMINE SULFATE 10 MG/ML IV SOLN
INTRAVENOUS | Status: DC | PRN
  Administered 2012-08-27: 350 mg via INTRAVENOUS

## 2012-08-27 MED ORDER — INSULIN REGULAR BOLUS VIA INFUSION
0.0000 [IU] | Freq: Three times a day (TID) | INTRAVENOUS | Status: DC
  Filled 2012-08-27: qty 10

## 2012-08-27 MED ORDER — METOCLOPRAMIDE HCL 5 MG/ML IJ SOLN
10.0000 mg | Freq: Four times a day (QID) | INTRAMUSCULAR | Status: AC
  Administered 2012-08-27 – 2012-08-28 (×4): 10 mg via INTRAVENOUS
  Filled 2012-08-27 (×4): qty 2

## 2012-08-27 MED ORDER — ACETAMINOPHEN 160 MG/5ML PO SOLN
975.0000 mg | Freq: Four times a day (QID) | ORAL | Status: AC
  Administered 2012-08-28 (×2): 975 mg
  Filled 2012-08-27 (×2): qty 40.6

## 2012-08-27 MED ORDER — NITROGLYCERIN IN D5W 200-5 MCG/ML-% IV SOLN: 0.0000 ug/min | INTRAVENOUS | Status: DC

## 2012-08-27 MED ORDER — SODIUM BICARBONATE 8.4 % IV SOLN
50.0000 meq | Freq: Once | INTRAVENOUS | Status: AC
  Administered 2012-08-27: 50 meq via INTRAVENOUS

## 2012-08-27 MED ORDER — FAMOTIDINE IN NACL 20-0.9 MG/50ML-% IV SOLN
20.0000 mg | Freq: Two times a day (BID) | INTRAVENOUS | Status: AC
  Administered 2012-08-27 (×2): 20 mg via INTRAVENOUS
  Filled 2012-08-27: qty 50

## 2012-08-27 SURGICAL SUPPLY — 98 items
ATTRACTOMAT 16X20 MAGNETIC DRP (DRAPES) ×2 IMPLANT
BAG DECANTER FOR FLEXI CONT (MISCELLANEOUS) ×2 IMPLANT
BANDAGE ELASTIC 4 VELCRO ST LF (GAUZE/BANDAGES/DRESSINGS) ×2 IMPLANT
BANDAGE ELASTIC 6 VELCRO ST LF (GAUZE/BANDAGES/DRESSINGS) ×2 IMPLANT
BANDAGE GAUZE ELAST BULKY 4 IN (GAUZE/BANDAGES/DRESSINGS) ×2 IMPLANT
BASKET HEART (ORDER IN 25'S) (MISCELLANEOUS) ×1
BASKET HEART (ORDER IN 25S) (MISCELLANEOUS) ×1 IMPLANT
BLADE STERNUM SYSTEM 6 (BLADE) ×2 IMPLANT
BLADE SURG 11 STRL SS (BLADE) ×2 IMPLANT
CANISTER SUCTION 2500CC (MISCELLANEOUS) ×2 IMPLANT
CANNULA EZ GLIDE AORTIC 21FR (CANNULA) ×2 IMPLANT
CANNULA VENOUS LOW PROF 34X46 (CANNULA) IMPLANT
CATH CPB KIT HENDRICKSON (MISCELLANEOUS) ×2 IMPLANT
CATH ROBINSON RED A/P 18FR (CATHETERS) ×2 IMPLANT
CATH THORACIC 36FR (CATHETERS) ×2 IMPLANT
CATH THORACIC 36FR RT ANG (CATHETERS) ×2 IMPLANT
CLIP TI MEDIUM 24 (CLIP) IMPLANT
CLIP TI WIDE RED SMALL 24 (CLIP) ×6 IMPLANT
CLOTH BEACON ORANGE TIMEOUT ST (SAFETY) IMPLANT
COVER SURGICAL LIGHT HANDLE (MISCELLANEOUS) ×2 IMPLANT
CRADLE DONUT ADULT HEAD (MISCELLANEOUS) ×2 IMPLANT
DERMABOND ADVANCED (GAUZE/BANDAGES/DRESSINGS) ×1
DERMABOND ADVANCED .7 DNX12 (GAUZE/BANDAGES/DRESSINGS) ×1 IMPLANT
DRAPE CARDIOVASCULAR INCISE (DRAPES) ×1
DRAPE PROXIMA HALF (DRAPES) ×2 IMPLANT
DRAPE SLUSH/WARMER DISC (DRAPES) ×2 IMPLANT
DRAPE SRG 135X102X78XABS (DRAPES) ×1 IMPLANT
DRSG COVADERM 4X14 (GAUZE/BANDAGES/DRESSINGS) ×2 IMPLANT
DRSG SORBAVIEW 3.5X5-5/16 MED (GAUZE/BANDAGES/DRESSINGS) ×2 IMPLANT
ELECT REM PT RETURN 9FT ADLT (ELECTROSURGICAL) ×6
ELECTRODE REM PT RTRN 9FT ADLT (ELECTROSURGICAL) ×3 IMPLANT
GLOVE BIO SURGEON STRL SZ 6 (GLOVE) ×4 IMPLANT
GLOVE BIO SURGEON STRL SZ 6.5 (GLOVE) ×16 IMPLANT
GLOVE BIO SURGEON STRL SZ7 (GLOVE) ×2 IMPLANT
GLOVE BIOGEL PI IND STRL 6.5 (GLOVE) ×4 IMPLANT
GLOVE BIOGEL PI IND STRL 7.0 (GLOVE) ×2 IMPLANT
GLOVE BIOGEL PI INDICATOR 6.5 (GLOVE) ×4
GLOVE BIOGEL PI INDICATOR 7.0 (GLOVE) ×2
GLOVE EUDERMIC 7 POWDERFREE (GLOVE) ×6 IMPLANT
GOWN PREVENTION PLUS XLARGE (GOWN DISPOSABLE) ×6 IMPLANT
GOWN STRL NON-REIN LRG LVL3 (GOWN DISPOSABLE) ×14 IMPLANT
HEMOSTAT POWDER SURGIFOAM 1G (HEMOSTASIS) ×6 IMPLANT
HEMOSTAT SURGICEL 2X14 (HEMOSTASIS) ×2 IMPLANT
INSERT FOGARTY XLG (MISCELLANEOUS) IMPLANT
KIT BASIN OR (CUSTOM PROCEDURE TRAY) ×2 IMPLANT
KIT ROOM TURNOVER OR (KITS) ×2 IMPLANT
KIT SUCTION CATH 14FR (SUCTIONS) ×4 IMPLANT
KIT VASOVIEW W/TROCAR VH 2000 (KITS) ×2 IMPLANT
MARKER GRAFT CORONARY BYPASS (MISCELLANEOUS) ×6 IMPLANT
MARKER SKIN DUAL TIP RULER LAB (MISCELLANEOUS) ×2 IMPLANT
NS IRRIG 1000ML POUR BTL (IV SOLUTION) ×12 IMPLANT
PACK OPEN HEART (CUSTOM PROCEDURE TRAY) ×2 IMPLANT
PAD ARMBOARD 7.5X6 YLW CONV (MISCELLANEOUS) ×4 IMPLANT
PAD ELECT DEFIB RADIOL ZOLL (MISCELLANEOUS) ×2 IMPLANT
PENCIL BUTTON HOLSTER BLD 10FT (ELECTRODE) ×2 IMPLANT
PUNCH AORTIC ROTATE 4.0MM (MISCELLANEOUS) IMPLANT
PUNCH AORTIC ROTATE 4.5MM 8IN (MISCELLANEOUS) ×2 IMPLANT
PUNCH AORTIC ROTATE 5MM 8IN (MISCELLANEOUS) IMPLANT
SOLUTION ANTI FOG 6CC (MISCELLANEOUS) ×2 IMPLANT
SPONGE GAUZE 4X4 12PLY (GAUZE/BANDAGES/DRESSINGS) ×8 IMPLANT
SPONGE LAP 18X18 X RAY DECT (DISPOSABLE) ×2 IMPLANT
STOPCOCK MORSE 400PSI 3WAY (MISCELLANEOUS) ×2 IMPLANT
SUT BONE WAX W31G (SUTURE) ×2 IMPLANT
SUT MNCRL AB 4-0 PS2 18 (SUTURE) ×4 IMPLANT
SUT PROLENE 3 0 SH DA (SUTURE) ×4 IMPLANT
SUT PROLENE 4 0 RB 1 (SUTURE) ×1
SUT PROLENE 4 0 SH DA (SUTURE) ×2 IMPLANT
SUT PROLENE 4-0 RB1 .5 CRCL 36 (SUTURE) ×1 IMPLANT
SUT PROLENE 6 0 C 1 30 (SUTURE) ×8 IMPLANT
SUT PROLENE 7 0 BV1 MDA (SUTURE) ×6 IMPLANT
SUT PROLENE 8 0 BV175 6 (SUTURE) ×8 IMPLANT
SUT SILK  1 MH (SUTURE)
SUT SILK 1 MH (SUTURE) IMPLANT
SUT STEEL 6MS V (SUTURE) IMPLANT
SUT STEEL STERNAL CCS#1 18IN (SUTURE) IMPLANT
SUT STEEL SZ 6 DBL 3X14 BALL (SUTURE) ×6 IMPLANT
SUT VIC AB 1 CTX 36 (SUTURE) ×2
SUT VIC AB 1 CTX36XBRD ANBCTR (SUTURE) ×2 IMPLANT
SUT VIC AB 2-0 CT1 27 (SUTURE) ×2
SUT VIC AB 2-0 CT1 TAPERPNT 27 (SUTURE) ×2 IMPLANT
SUT VIC AB 2-0 CTX 27 (SUTURE) IMPLANT
SUT VIC AB 3-0 SH 27 (SUTURE)
SUT VIC AB 3-0 SH 27X BRD (SUTURE) IMPLANT
SUT VIC AB 3-0 X1 27 (SUTURE) IMPLANT
SUT VICRYL 4-0 PS2 18IN ABS (SUTURE) IMPLANT
SUTURE E-PAK OPEN HEART (SUTURE) ×2 IMPLANT
SYSTEM SAHARA CHEST DRAIN ATS (WOUND CARE) ×2 IMPLANT
TAPE CLOTH SURG 4X10 WHT LF (GAUZE/BANDAGES/DRESSINGS) ×4 IMPLANT
TOWEL OR 17X24 6PK STRL BLUE (TOWEL DISPOSABLE) ×4 IMPLANT
TOWEL OR 17X26 10 PK STRL BLUE (TOWEL DISPOSABLE) ×4 IMPLANT
TRAY CATH LUMEN 1 20CM STRL (SET/KITS/TRAYS/PACK) ×2 IMPLANT
TRAY FOLEY IC TEMP SENS 14FR (CATHETERS) IMPLANT
TUBE FEEDING 8FR 16IN STR KANG (MISCELLANEOUS) ×2 IMPLANT
TUBE SUCT INTRACARD DLP 20F (MISCELLANEOUS) ×2 IMPLANT
TUBING ART PRESS 48 MALE/FEM (TUBING) ×4 IMPLANT
TUBING INSUFFLATION 10FT LAP (TUBING) ×2 IMPLANT
UNDERPAD 30X30 INCONTINENT (UNDERPADS AND DIAPERS) ×2 IMPLANT
WATER STERILE IRR 1000ML POUR (IV SOLUTION) ×4 IMPLANT

## 2012-08-27 NOTE — Progress Notes (Signed)
S/p CABG x 4  Intubated, sedated, not awake enough to wean yet  BP 74/30  Pulse 89  Temp(Src) 97.5 F (36.4 C)  Resp 21  Ht 5\' 6"  (1.676 m)  Wt 277 lb 9 oz (125.9 kg)  BMI 44.82 kg/m2  SpO2 96%  BP currently 103/64  CI 1.6   Intake/Output Summary (Last 24 hours) at 08/27/12 1830 Last data filed at 08/27/12 1800  Gross per 24 hour  Intake 7943.28 ml  Output   5025 ml  Net 2918.28 ml    Minimal CT output  Doing well overall  CBG better with insulin gtt  Will give albumin + Ca 2+ - if index remains low will start dopamine

## 2012-08-27 NOTE — Progress Notes (Signed)
ANTICOAGULATION CONSULT NOTE - Follow Up Consult  Pharmacy Consult for heparin Indication: CAD awaiting CABG  Labs:  Recent Labs  08/25/12 1145 08/25/12 1901 08/26/12 0100 08/26/12 0935 08/26/12 1800 08/27/12 0045 08/27/12 0100  HGB 14.5  --   --  13.9  --  12.9*  --   HCT 43.2  --   --  40.9  --  38.6*  --   PLT 161  --   --  179  --  160  --   APTT 27  --   --   --   --   --   --   LABPROT 13.3  --   --   --   --   --   --   INR 1.03  --   --   --   --   --   --   HEPARINUNFRC  --   --   --  <0.10* <0.10*  --  0.20*  CREATININE 1.05  --   --   --   --  1.13  --   TROPONINI <0.30 >20.00* >20.00*  --   --   --   --     Assessment: 72yo male remains subtherapeutic on heparin after rate increase though level approaching goal.  Goal of Therapy:  Heparin level 0.3-0.7 units/ml   Plan:  Will increase heparin gtt by 2 units/kg/hr to 2100 units/hr until off at 0400 for OR.  Wynona Neat, PharmD, BCPS  08/27/2012,1:36 AM

## 2012-08-27 NOTE — Preoperative (Signed)
Beta Blockers   Reason not to administer Beta Blockers:Pt took Atenolol 12.5 mg po @0600  hrs. on 08/27/2012

## 2012-08-27 NOTE — OR Nursing (Signed)
Rectal Probe inserted without difficulty prior to patient positioning and prep.

## 2012-08-27 NOTE — Transfer of Care (Signed)
Immediate Anesthesia Transfer of Care Note  Patient: Max Anderson  Procedure(s) Performed: Procedure(s): CORONARY ARTERY BYPASS GRAFTING times four using Left Greater Saphenous Vein Graft harvested endoscopically and Left Internal Mammary Artery (N/A) TRANSESOPHAGEAL ECHOCARDIOGRAM (TEE) (N/A)  Patient Location: SICU  Anesthesia Type:General  Level of Consciousness: Patient remains intubated per anesthesia plan  Airway & Oxygen Therapy: Patient remains intubated per anesthesia plan and Patient placed on Ventilator (see vital sign flow sheet for setting)  Post-op Assessment: Report given to SICU RN and Post -op Vital signs reviewed and stable  Post vital signs: Reviewed and stable  Complications: No apparent anesthesia complications

## 2012-08-27 NOTE — OR Nursing (Signed)
Rectal Tube removed after case.

## 2012-08-27 NOTE — Progress Notes (Addendum)
Increased set rate to 14 per Dr. Roxan Hockey  Also,  CRNA reports that all of pt's teeth are very loose (prior to surgery).

## 2012-08-27 NOTE — Anesthesia Preprocedure Evaluation (Signed)
Anesthesia Evaluation  Patient identified by MRN, date of birth, ID band Patient awake    Reviewed: Allergy & Precautions, H&P , NPO status , Patient's Chart, lab work & pertinent test results  Airway       Dental   Pulmonary          Cardiovascular hypertension, + CAD, + Past MI, + Cardiac Stents and + Peripheral Vascular Disease     Neuro/Psych    GI/Hepatic   Endo/Other  diabetes  Renal/GU      Musculoskeletal   Abdominal   Peds  Hematology   Anesthesia Other Findings   Reproductive/Obstetrics                           Anesthesia Physical Anesthesia Plan  ASA: III  Anesthesia Plan: General   Post-op Pain Management:    Induction: Intravenous  Airway Management Planned: Oral ETT  Additional Equipment: Arterial line, CVP, PA Cath and TEE  Intra-op Plan:   Post-operative Plan: Post-operative intubation/ventilation  Informed Consent: I have reviewed the patients History and Physical, chart, labs and discussed the procedure including the risks, benefits and alternatives for the proposed anesthesia with the patient or authorized representative who has indicated his/her understanding and acceptance.     Plan Discussed with: CRNA, Anesthesiologist and Surgeon  Anesthesia Plan Comments:         Anesthesia Quick Evaluation

## 2012-08-27 NOTE — Progress Notes (Signed)
Dr Roxan Hockey to bedside. Updated most recent CI 1.6, CO 3.7, Neosynephrine gtt increased to 62mcg from 65 mcg due to sbp trending down into 90s with MAP in 60s. Pt received total of 4 albumins post op in SICU, orders received to give 5th albumin and to mix with 1amp Calcium Chloride. MD stated is CI remain <2, start dopamine gtt at 11mcg. Md updated precedex gtt off, pt just beginning to move head around, no wean on vent thus far, waiting for pt to wake up more. MD updated on CBGs/insulin gtt, CT output and urine output. Will continue to monitor. Max Anderson

## 2012-08-27 NOTE — Interval H&P Note (Signed)
History and Physical Interval Note:  08/27/2012 7:33 AM  Max Anderson  has presented today for surgery, with the diagnosis of CAD  The various methods of treatment have been discussed with the patient and family. After consideration of risks, benefits and other options for treatment, the patient has consented to  Procedure(s): CORONARY ARTERY BYPASS GRAFTING (CABG) (N/A) as a surgical intervention .  The patient's history has been reviewed, patient examined, no change in status, stable for surgery.  I have reviewed the patient's chart and labs.  Questions were answered to the patient's satisfaction.     [name]

## 2012-08-27 NOTE — Progress Notes (Signed)
Pt. Placed himself on CPAP tonight & tolerated it well. CPAP is set at 8cm H2O per pt. Comfort via FFM.

## 2012-08-27 NOTE — Progress Notes (Signed)
per CRNA pt was on 750 vT in OR.  Pt w/ high PIP = 40-45, lowered vT to 10 ml/kg per SICU protocol in effort to lower PIP, now PIP 35.  RN aware of vent change.

## 2012-08-27 NOTE — Progress Notes (Signed)
Patient ID: Max Anderson, male   DOB: 07-Jul-1940, 72 y.o.   MRN: [id number]  Patient: Max Anderson / Admit Date: 08/25/2012 / Date of Encounter: 08/27/2012, 8:03 AM   Subjective  Seen before going for CABG.  Nervous No chest pain    Objective   Telemetry: NSR Physical Exam: Filed Vitals:   08/26/12 0400  BP: 113/68  Pulse: 64  Temp: 98.1 F (36.7 C)  Resp: 18   General: Well developed obese WM in no acute distress. Head: Normocephalic, atraumatic, sclera non-icteric, no xanthomas, nares are without discharge. Neck:JVD not elevated. Lungs: Clear bilaterally to auscultation without wheezes, rales, or rhonchi. Breathing is unlabored. Heart: RRR distant heart sounds S1 S2 without murmurs, rubs, or gallops.  Abdomen: Soft, non-tender, non-distended with normoactive bowel sounds. No hepatomegaly. No rebound/guarding. No obvious abdominal masses. Msk:  Strength and tone appear normal for age. Extremities: No clubbing or cyanosis. No edema.  Distal pedal pulses are 2+ and equal bilaterally. Neuro: Alert and oriented X 3. Moves all extremities spontaneously. Psych:  Responds to questions appropriately with a normal affect.    Intake/Output Summary (Last 24 hours) at 08/27/12 0803 Last data filed at 08/27/12 0530  Gross per 24 hour  Intake 765.58 ml  Output   2350 ml  Net -1584.42 ml    Inpatient Medications:  . [facility] HOLD] allopurinol  300 mg Oral Daily  . aminocaproic acid (AMICAR) for OHS   Intravenous To OR  . [[facility] HOLD] amLODipine  5 mg Oral Daily  . [facility] HOLD] aspirin  324 mg Oral Once  . [facility] HOLD] aspirin EC  81 mg Oral Daily  . [facility] HOLD] atenolol  50 mg Oral Daily  . [MAR HOLD] buPROPion  300 mg Oral Daily  . cefUROXime (ZINACEF)  IV  1.5 g Intravenous To OR  . cefUROXime (ZINACEF)  IV  750 mg Intravenous To OR  . dexmedetomidine  0.1-0.7 mcg/kg/hr Intravenous To OR  . DOPamine  2-20 mcg/kg/min Intravenous To OR  . epinephrine  0.5-20 mcg/min Intravenous To OR    . [facility] HOLD] furosemide  40 mg Oral BID  . heparin-papaverine-plasmalyte irrigation   Irrigation To OR  . heparin 30,000 units/NS 1000 mL solution for CELLSAVER   Other To OR  . [[facility] HOLD] insulin aspart  0-15 Units Subcutaneous TID WC  . insulin (NOVOLIN-R) infusion   Intravenous To OR  . [facility] HOLD] losartan  100 mg Oral Daily  . magnesium sulfate  40 mEq Other To OR  . nitroGLYCERIN  2-200 mcg/min Intravenous To OR  . [facility] HOLD] PARoxetine  40 mg Oral Q0600  . phenylephrine (NEO-SYNEPHRINE) Adult infusion  30-200 mcg/min Intravenous To OR  . [facility] HOLD] potassium chloride  10 mEq Oral BID  . potassium chloride  80 mEq Other To OR  . [facility] HOLD] sodium chloride  3 mL Intravenous Q12H  . vancomycin  1,500 mg Intravenous To OR    Labs:  Recent Labs  08/25/12 1145 08/26/12 0100 08/27/12 0045  NA 137  --  133*  K 4.5  --  4.0  CL 101  --  97  CO2 29  --  28  GLUCOSE 237*  --  286*  BUN 20  --  18  CREATININE 1.05  --  1.13  CALCIUM 9.1  --  9.0  MG  --  1.7 1.8    Recent Labs  08/25/12 1145 08/26/12 0935 08/27/12 0045  WBC 8.0 8.7 9.5  NEUTROABS  6.6  --   --   HGB 14.5 13.9 12.9*  HCT 43.2 40.9 38.6*  MCV 93.1 91.7 90.6  PLT 161 179 160    Recent Labs  08/25/12 1145 08/25/12 1901 08/26/12 0100  TROPONINI <0.30 >20.00* >20.00*     Radiology/Studies:  Dg Chest Port 1 View 08/25/2012   *RADIOLOGY REPORT*  Clinical Data: Chest pain.  PORTABLE CHEST - 1 VIEW  Comparison: 10/22/2010  Findings: Lung volumes are very low bilaterally.  Heart size is within normal limits.  There is no evidence of overt edema, airspace consolidation or pleural fluid.  IMPRESSION: Low lung volumes.   Original Report Authenticated By: Max Anderson, M.D.     Assessment and Plan  CAD:  P2Y relatively high Appreciate Dr Max Anderson doing surgery today Echo showed normal EF despite very high troponin HTN:  ON ARB Obesity:  Will need cardiac rehab and dietary consult post oop   Max Anderson 8:03 AM

## 2012-08-27 NOTE — Progress Notes (Signed)
Recruitment Maneuver preformed for 2 minutes. PC 40, RR 10, FiO2 100 I time 3.0. Pt tolerated well Sats 100%. RT will continue to monitor.

## 2012-08-27 NOTE — Anesthesia Postprocedure Evaluation (Signed)
  Anesthesia Post-op Note  Patient: Max Anderson  Procedure(s) Performed: Procedure(s): CORONARY ARTERY BYPASS GRAFTING times four using Left Greater Saphenous Vein Graft harvested endoscopically and Left Internal Mammary Artery (N/A) TRANSESOPHAGEAL ECHOCARDIOGRAM (TEE) (N/A)  Patient Location: SICU  Anesthesia Type:General  Level of Consciousness: sedated and Patient remains intubated per anesthesia plan  Airway and Oxygen Therapy: Patient remains intubated per anesthesia plan and Patient placed on Ventilator (see vital sign flow sheet for setting)  Post-op Pain: none  Post-op Assessment: Post-op Vital signs reviewed, Patient's Cardiovascular Status Stable, Respiratory Function Stable, Patent Airway, No signs of Nausea or vomiting and Pain level controlled  Post-op Vital Signs: stable  Complications: No apparent anesthesia complications

## 2012-08-27 NOTE — OR Nursing (Signed)
First call made to 2300 at 1237; second call made to 2300 at 1303.

## 2012-08-27 NOTE — Op Note (Signed)
NAME:  JECOREY, FUSILLO NO.:  [id number]  MEDICAL RECORD NO.:  [id number]  LOCATION:  [id number]                         FACILITY:  [place]  PHYSICIAN:  Revonda Standard. Roxan Hockey, M.D.DATE OF BIRTH:  Oct 03, 1940  DATE OF PROCEDURE:  08/27/2012 DATE OF DISCHARGE:                              OPERATIVE REPORT   PREOPERATIVE DIAGNOSIS:  Severe three-vessel coronary disease, status post myocardial infarction.  POSTOPERATIVE DIAGNOSIS:  Severe three-vessel coronary disease, status post myocardial infarction.  PROCEDURE:  Median sternotomy, extracorporeal circulation, coronary artery bypass grafting x4 (left internal mammary artery to left anterior descending artery, saphenous vein graft to posterior descending, sequential saphenous vein graft to obtuse marginals 1 and 2), endoscopic vein harvest of left leg, placement of right femoral arterial monitoring line.  SURGEON:  Revonda Standard. Roxan Hockey, M.D.  ASSISTANTEllwood Handler, PA.  ANESTHESIA:  General.  FINDINGS:  Procedure was difficult due to the patient's body habitus and morbid obesity.  Left mammary large caliber, good flow, vein fair quality.  OM 1, small and fair quality, target difficult to visualize. Remaining targets, good quality.  Transesophageal echocardiography revealed some lateral wall hypokinesis and 1 to 2+ mitral regurgitation pre and post bypass.  CLINICAL NOTE:  Mr. Morsch is a 72 year old gentleman with multiple cardiac risk factors and known coronary disease with previous stent placement.  He presented with a non Q-wave myocardial infarction.  At catheterization, he was found to have severe 3-vessel coronary artery disease, not amenable to percutaneous intervention.  He was referred for coronary artery bypass grafting.  The indications, risks, benefits, and alternatives were discussed in detail with the patient.  He understood and accepted the risks and agreed to proceed.  OPERATIVE NOTE:  Mr.  Renick was brought to the preoperative holding area on August 27, 2012.  There, Anesthesia placed a Swan-Ganz catheter and arterial blood pressure monitoring line.  Intravenous antibiotics were administered.  He was taken to the operating room, anesthetized, and intubated.  A Foley catheter was in place.  It had been placed the night before the surgery by Urology.  The chest, abdomen, and legs were prepped and draped in the usual sterile fashion.  There was technical difficulty with the arterial line with tucking the patient's left arm to his side, therefore the arm was left extended on an arm board.  A right femoral arterial blood pressure monitoring line was placed due to issues with the radial arterial line.  This was done using the Seldinger technique without difficulty, and there was a good pressure tracing.  An incision was made in the medial aspect of the left leg just above the knee.  The greater saphenous vein was identified and was harvested endoscopically from the upper calf to the groin.  The vein was of large caliber and fair quality.  Simultaneously with this, a median sternotomy was performed, and the left internal mammary artery was harvested using standard technique.  The mammary artery was very difficult to harvest due to the patient's morbid obesity and relatively stiff chest wall. 2000 units of heparin was administered during the vessel harvest.  The remainder of the full heparin dose was given prior to opening the pericardium.  The  pericardium was opened after confirming adequate anticoagulation with ACT measurement.  The aorta was cannulated via concentric 2-0 Ethibond pledgeted pursestring sutures.  A dual-stage venous cannula was placed via a pursestring suture in the right atrial appendage. Cardiopulmonary bypass instituted, and the patient was cooled to 32 degrees Celsius.  The coronary arteries were inspected, and anastomotic sites were chosen.  The conduits  were inspected and cut to length.  A foam pad was placed in the pericardium to insulate the heart and protect the left phrenic nerve.  A temperature probe was placed in myocardial septum, and a cardioplegic cannula was placed in the ascending aorta.  The aorta was crossclamped.  The left ventricle was emptied via the aortic root vent.  Cardiac arrest then was achieved with a combination of cold antegrade blood cardioplegia, and topical iced saline.  1 L of cardioplegia was administered.  There was a rapid diastolic arrest and septal cooling to 10 degrees Celsius.  The following distal anastomoses were performed.  First, a reversed saphenous vein graft was placed end-to-side to the posterior descending branch of the right coronary.  This graft was placed right at the origin of the posterior descending, and a probe passed easily back into the right coronary as well as distally in the right coronary to the posterior lateral branch.  Posterior descending was a 1.5 mm good quality target.  The vein was large caliber, fair quality. It was anastomosed end-to-side with running 7-0 Prolene suture.  At the completion of the anastomosis, there was a good flow through the graft, and good hemostasis when cardioplegia was administered.  Next, a reversed saphenous vein graft was placed sequentially to obtuse marginals 1 and 2.  A side-to-side anastomosis was performed to OM 1. It was a 1.5 mm vessel.  It difficult to access due to the patient's body habitus and being relatively high in the atrioventricular groove.  The side-to-side anastomosis was performed with a running 7-0 Prolene suture.  A probe passed easily proximally and distally at the completion of the anastomosis.  The distal end of the vein then was beveled, was anastomosed end-to-side to OM 2.  This was a 1.5 mm good quality target. At the completion of the 2nd anastomosis, cardioplegia was administered down the graft, and there was good  flow and good hemostasis.  Additional cardioplegia was administered down the aortic root.  Next, the left internal mammary artery was brought through a window in the pericardium.  The distal end was beveled.  It was then anastomosed end-to-side to the distal LAD.  The LAD was a 2-mm good quality target. The mammary was a 2-mm good quality conduit.  The anastomosis was performed end-to-side with a running 8-0 Prolene suture.  At the completion of the anastomosis, the bulldog clamp was briefly removed to inspect for hemostasis.  Immediate and rapid septal rewarming was noted. The bulldog clamp was replaced.  The mammary pedicle was tacked to the epicardial surface of the heart with 6-0 Prolene sutures.  Additional cardioplegia was administered.  The cardioplegic cannula then was removed from the ascending aorta.  The proximal vein graft anastomoses were performed to 4.5 mm punch aortotomies with running 6-0 Prolene sutures.  At the completion of final proximal anastomosis, the patient was placed in Trendelenburg position.  Lidocaine was administered.  The aortic root was de-aired, and aortic crossclamp was removed.  Total crossclamp time was 92 minutes.  The patient spontaneously resumed sinus rhythm and did not require defibrillation. While rewarming was  completed, all proximal and distal anastomoses were inspected for hemostasis.  Epicardial pacing wires were placed on the right ventricle and right atrium.  DDD pacing was initiated at 90 beats per minute.  When the patient had rewarmed to a core temperature of 37 degrees Celsius, he was weaned from cardiopulmonary bypass on the first attempt.  Total bypass time was 126 minutes.  Postbypass transesophageal echocardiography was essentially unchanged.  The initial cardiac index was greater than 2 liters/minute/meter squared.  The test dose of protamine was administered and was well tolerated.  The atrial and aortic cannulae were removed.   The remainder of protamine was administered without incident.  The chest was irrigated with warm saline.  Hemostasis was achieved.  The pericardium was reapproximated with interrupted 3-0 silk sutures.  The left pleural and mediastinal chest tubes were placed in separate subcostal incisions.  The sternum was closed with a combination of single and double heavy gauge stainless steel wires.  The pectoralis fascia, subcutaneous tissue, and skin were closed in standard fashion.  All sponge, needle, and instrument counts were correct at the end of the procedure.  The patient was taken from the operating room to the surgical intensive care unit in good condition.     Revonda Standard Roxan Hockey, M.D.     SCH/MEDQ  D:  08/27/2012  T:  08/27/2012  Job:  [id number]

## 2012-08-27 NOTE — Procedures (Deleted)
Bedside Tracheostomy Insertion Procedure Note   Patient Details:   Name: Max Anderson DOB: August 19, 1940 MRN: [id number]  Procedure: Tracheostomy  Pre Procedure Assessment: ET Tube Size7.5 ET Tube secured at lip (cm):21 Bite block in place: Yes Breath Sounds: Rhonch  Post Procedure Assessment: BP 116/57  Pulse 70  Temp(Src) 98.5 F (36.9 C) (Oral)  Resp 22  Ht 5\' 6"  (1.676 m)  Wt 277 lb 9 oz (125.9 kg)  BMI 44.82 kg/m2  SpO2 97% O2 sats: stable throughout Complications: No apparent complications Patient did tolerate procedure well Tracheostomy Brand:Shiley Tracheostomy Style:Cuffed Tracheostomy Size: 8.0 Tracheostomy Secured [id number] and  velcro Tracheostomy Placement Confirmation:Trach cuff visualized and in place and chest x-ray  Procedure tolerated well Alarm setting adjusted during procedur.  Max Anderson, RRT,RCP informed of changes  Johnnette Gourd 08/27/2012, 7:24 AM

## 2012-08-27 NOTE — Anesthesia Procedure Notes (Addendum)
Procedure Name: Intubation Date/Time: 08/27/2012 7:55 AM Performed by: Jacquiline Doe A Pre-anesthesia Checklist: Patient identified, Timeout performed, Emergency Drugs available, Suction available and Patient being monitored Patient Re-evaluated:Patient Re-evaluated prior to inductionOxygen Delivery Method: Circle system utilized Preoxygenation: Pre-oxygenation with 100% oxygen Intubation Type: IV induction and Cricoid Pressure applied Ventilation: Mask ventilation without difficulty Laryngoscope Size: Mac and 4 Grade View: Grade I Tube type: Oral Tube size: 8.0 mm Number of attempts: 1 Airway Equipment and Method: Stylet Placement Confirmation: ETT inserted through vocal cords under direct vision,  breath sounds checked- equal and bilateral and positive ETCO2 Secured at: 24 cm Tube secured with: Tape Dental Injury: Teeth and Oropharynx as per pre-operative assessment     PA catheter:  Routine monitors. Timeout, sterile prep, drape, FBP R neck.  Trendelenburg position.  1% Lido local, finder and trocar RIJ 1st pass with US guidance.  Cordis placed over J wire. PA catheter in easily.  Sterile dressing applied.  Patient tolerated well, VSS.  Jenita Seashore, MD  06:47-07:00

## 2012-08-27 NOTE — Progress Notes (Signed)
  Echocardiogram Echocardiogram Transesophageal has been performed.  Mauricio Po 08/27/2012, 9:11 AM

## 2012-08-27 NOTE — H&P (View-Only) (Signed)
Reason for Consult:3 vessel CAD s/p NQWMI Referring Physician: Dr. Kate Sable Max Anderson is an 72 y.o. male.  HPI: 72 yo male with known CAD presents with a cc/o CP  Mr. Kamm has multiple CRF including diabetes, HTN, hyperlipidemia, + family history and remote tobacco abuse. He has known CAD with prior stents going back to 2007. He also has aneurysmal PAD and has had an endograft repair of AAA and a right AK- BK popliteal bypass for an aneurysm.  He has been having CP and SOB with exertion for some time. These episodes have generally been short-lived and mild. He was not taking NTG on any regular basis. He is fairly sedentary. He had sudden onset of severe CP yesterday AM. He described this a pressure in his chest radiating to the right arm. He took 3 SL NTG over a 20 minute period without relief. His wife took him to the ED at [facility] and subsequently transferred to [facility].His initial enzymes were negative, but at 7PM yesterday evening troponin was > 20. He had cardiac catheterization which revealed severe 3 vessel CAD.   His pain improved but he had some recurrent pain this AM. He says currently he has some mild discomfort- "better than it has been since it started"  Past Medical History  Diagnosis Date  . CAD (coronary artery disease)     multiple stents to the circ and right coronary artery. 2007 [facility]  . HTN (hypertension)   . AAA (abdominal aortic aneurysm)   . Gout   . Diabetes mellitus   . Prostate cancer   . Urinary incontinence due to urethral sphincter incompetence     Past Surgical History  Procedure Laterality Date  . Coronary stent placement       He had 2.5 x 20-mm Taxus stent placed in the  his circ in August 2007   He had 2.5 x 20-mm Taxus stent placed in the   mid circ and 2.5 x 16-mm stent placed in the distal circ.  [name] Nose surgery    . Prostate surgery  1998    has urinary prosthesis for incontinence  . Appendectomy    . Cholecystectomy    . Ankle fracture  surgery    . Above -knee to below knee popliteal bypass  novemeber 2010  . Knee surgery  10-2010  . Back surgery      Family History  Problem Relation Age of Onset  . Heart attack Mother     Social History:  reports that he quit smoking about 25 years ago. He does not have any smokeless tobacco history on file. He reports that he does not drink alcohol or use illicit drugs.  Allergies:  Allergies  Allergen Reactions  . Niaspan (Niacin)   . Statins     Myalgias  . Tolectin (Tolmetin Sodium)     Medications:  Prior to Admission:  Prescriptions prior to admission  Medication Sig Dispense Refill  . allopurinol (ZYLOPRIM) 300 MG tablet Take 300 mg by mouth daily.        [name] amLODipine (NORVASC) 5 MG tablet Take 5 mg by mouth daily.      [name] aspirin 81 MG tablet Take 81 mg by mouth daily.        [name] atenolol (TENORMIN) 50 MG tablet Take 50 mg by mouth daily.      [name] buPROPion (WELLBUTRIN XL) 300 MG 24 hr tablet Take 300 mg by mouth daily.      . clonazePAM (  KLONOPIN) 0.5 MG tablet Take 0.5 mg by mouth 2 (two) times daily as needed for anxiety. Takes 1 tablet daily.      . clopidogrel (PLAVIX) 75 MG tablet Take 75 mg by mouth daily.       . furosemide (LASIX) 20 MG tablet Take 20 mg by mouth daily.      [name] HYDROcodone-acetaminophen (NORCO) 10-325 MG per tablet Take 1 tablet by mouth every 6 (six) hours as needed for pain.      [name] insulin NPH-insulin regular (HUMULIN 70/30) (70-30) 100 UNIT/ML injection Inject 110 Units into the skin 2 (two) times daily with a meal. As directed      . isosorbide mononitrate (IMDUR) 60 MG 24 hr tablet Take 60 mg by mouth daily.      [name] losartan (COZAAR) 100 MG tablet Take 100 mg by mouth daily.      . nitroGLYCERIN (NITROSTAT) 0.4 MG SL tablet Place 0.4 mg under the tongue every 5 (five) minutes as needed for chest pain.      [name] PARoxetine (PAXIL) 40 MG tablet Take 40 mg by mouth every morning.        . potassium chloride (K-DUR) 10 MEQ tablet Take 10 mEq by  mouth 2 (two) times daily.        Results for orders placed during the hospital encounter of 08/25/12 (from the past 48 hour(s))  CBC WITH DIFFERENTIAL     Status: Abnormal   Collection Time    08/25/12 11:45 AM      Result Value Range   WBC 8.0  4.0 - 10.5 K/uL   RBC 4.64  4.22 - 5.81 MIL/uL   Hemoglobin 14.5  13.0 - 17.0 g/dL   HCT 43.2  39.0 - 52.0 %   MCV 93.1  78.0 - 100.0 fL   MCH 31.3  26.0 - 34.0 pg   MCHC 33.6  30.0 - 36.0 g/dL   RDW 15.4  11.5 - 15.5 %   Platelets 161  150 - 400 K/uL   Neutrophils Relative % 83 (*) 43 - 77 %   Neutro Abs 6.6  1.7 - 7.7 K/uL   Lymphocytes Relative 10 (*) 12 - 46 %   Lymphs Abs 0.8  0.7 - 4.0 K/uL   Monocytes Relative 6  3 - 12 %   Monocytes Absolute 0.5  0.1 - 1.0 K/uL   Eosinophils Relative 1  0 - 5 %   Eosinophils Absolute 0.0  0.0 - 0.7 K/uL   Basophils Relative 0  0 - 1 %   Basophils Absolute 0.0  0.0 - 0.1 K/uL  BASIC METABOLIC PANEL     Status: Abnormal   Collection Time    08/25/12 11:45 AM      Result Value Range   Sodium 137  135 - 145 mEq/L   Potassium 4.5  3.5 - 5.1 mEq/L   Chloride 101  96 - 112 mEq/L   CO2 29  19 - 32 mEq/L   Glucose, Bld 237 (*) 70 - 99 mg/dL   BUN 20  6 - 23 mg/dL   Creatinine, Ser 1.05  0.50 - 1.35 mg/dL   Calcium 9.1  8.4 - 10.5 mg/dL   GFR calc non Af Amer 69 (*) >90 mL/min   GFR calc Af Amer 80 (*) >90 mL/min   Comment:            The eGFR has been calculated     using the CKD EPI equation.  This calculation has not been     validated in all clinical     situations.     eGFR's persistently     <90 mL/min signify     possible Chronic Kidney Disease.  TROPONIN I     Status: None   Collection Time    08/25/12 11:45 AM      Result Value Range   Troponin I <0.30  <0.30 ng/mL   Comment:            Due to the release kinetics of cTnI,     a negative result within the first hours     of the onset of symptoms does not rule out     myocardial infarction with certainty.     If myocardial  infarction is still suspected,     repeat the test at appropriate intervals.  PROTIME-INR     Status: None   Collection Time    08/25/12 11:45 AM      Result Value Range   Prothrombin Time 13.3  11.6 - 15.2 seconds   INR 1.03  0.00 - 1.49  APTT     Status: None   Collection Time    08/25/12 11:45 AM      Result Value Range   aPTT 27  24 - 37 seconds  MRSA PCR SCREENING     Status: None   Collection Time    08/25/12  3:31 PM      Result Value Range   MRSA by PCR NEGATIVE  NEGATIVE   Comment:            The GeneXpert MRSA Assay (FDA     approved for NASAL specimens     only), is one component of a     comprehensive MRSA colonization     surveillance program. It is not     intended to diagnose MRSA     infection nor to guide or     monitor treatment for     MRSA infections.  GLUCOSE, CAPILLARY     Status: Abnormal   Collection Time    08/25/12  6:45 PM      Result Value Range   Glucose-Capillary 155 (*) 70 - 99 mg/dL  TROPONIN I     Status: Abnormal   Collection Time    08/25/12  7:01 PM      Result Value Range   Troponin I >20.00 (*) <0.30 ng/mL   Comment:            Due to the release kinetics of cTnI,     a negative result within the first hours     of the onset of symptoms does not rule out     myocardial infarction with certainty.     If myocardial infarction is still suspected,     repeat the test at appropriate intervals.     CRITICAL RESULT CALLED TO, READ BACK BY AND VERIFIED WITH:     [place] 08/25/12 WBOND  GLUCOSE, CAPILLARY     Status: Abnormal   Collection Time    08/25/12  9:52 PM      Result Value Range   Glucose-Capillary 233 (*) 70 - 99 mg/dL  LIPID PANEL     Status: Abnormal   Collection Time    08/26/12  1:00 AM      Result Value Range   Cholesterol 243 (*) 0 - 200 mg/dL   Triglycerides 690 (*) <150 mg/dL   HDL  24 (*) >39 mg/dL   Total CHOL/HDL Ratio 10.1     VLDL UNABLE TO CALCULATE IF TRIGLYCERIDE OVER 400 mg/dL  0 - 40 mg/dL    LDL Cholesterol UNABLE TO CALCULATE IF TRIGLYCERIDE OVER 400 mg/dL  0 - 99 mg/dL   Comment:            Total Cholesterol/HDL:CHD Risk     Coronary Heart Disease Risk Table                         Men   Women      1/2 Average Risk   3.4   3.3      Average Risk       5.0   4.4      2 X Average Risk   9.6   7.1      3 X Average Risk  23.4   11.0                Use the calculated Patient Ratio     above and the CHD Risk Table     to determine the patient's CHD Risk.                ATP III CLASSIFICATION (LDL):      <100     mg/dL   Optimal      100-129  mg/dL   Near or Above                        Optimal      130-159  mg/dL   Borderline      160-189  mg/dL   High      >190     mg/dL   Very High  T4, FREE     Status: None   Collection Time    08/26/12  1:00 AM      Result Value Range   Free T4 0.95  0.80 - 1.80 ng/dL  MAGNESIUM     Status: None   Collection Time    08/26/12  1:00 AM      Result Value Range   Magnesium 1.7  1.5 - 2.5 mg/dL  TROPONIN I     Status: Abnormal   Collection Time    08/26/12  1:00 AM      Result Value Range   Troponin I >20.00 (*) <0.30 ng/mL   Comment: CRITICAL VALUE NOTED.  VALUE IS CONSISTENT WITH PREVIOUSLY REPORTED AND CALLED VALUE.  TSH     Status: None   Collection Time    08/26/12  1:00 AM      Result Value Range   TSH 2.483  0.350 - 4.500 uIU/mL  GLUCOSE, CAPILLARY     Status: Abnormal   Collection Time    08/26/12  7:36 AM      Result Value Range   Glucose-Capillary 241 (*) 70 - 99 mg/dL  HEPARIN LEVEL (UNFRACTIONATED)     Status: Abnormal   Collection Time    08/26/12  9:35 AM      Result Value Range   Heparin Unfractionated <0.10 (*) 0.30 - 0.70 IU/mL   Comment:            IF HEPARIN RESULTS ARE BELOW     EXPECTED VALUES, AND PATIENT     DOSAGE HAS BEEN CONFIRMED,     SUGGEST FOLLOW UP TESTING     OF ANTITHROMBIN III LEVELS.  REPEATED TO VERIFY  CBC     Status: Abnormal   Collection Time    08/26/12  9:35 AM       Result Value Range   WBC 8.7  4.0 - 10.5 K/uL   RBC 4.46  4.22 - 5.81 MIL/uL   Hemoglobin 13.9  13.0 - 17.0 g/dL   HCT 40.9  39.0 - 52.0 %   MCV 91.7  78.0 - 100.0 fL   MCH 31.2  26.0 - 34.0 pg   MCHC 34.0  30.0 - 36.0 g/dL   RDW 15.7 (*) 11.5 - 15.5 %   Platelets 179  150 - 400 K/uL  PLATELET INHIBITION P2Y12     Status: None   Collection Time    08/26/12  9:35 AM      Result Value Range   Platelet Function  P2Y12 261  194 - 418 PRU   Comment:            The literature has shown a direct     correlation of PRU values over     230 with higher risks of     thrombotic events.  Lower PRU     values are associated with     platelet inhibition.    Dg Chest Port 1 View  08/25/2012   *RADIOLOGY REPORT*  Clinical Data: Chest pain.  PORTABLE CHEST - 1 VIEW  Comparison: 10/22/2010  Findings: Lung volumes are very low bilaterally.  Heart size is within normal limits.  There is no evidence of overt edema, airspace consolidation or pleural fluid.  IMPRESSION: Low lung volumes.   Original Report Authenticated By: Aletta Edouard, M.D.    Review of Systems  Constitutional: Positive for malaise/fatigue.  Respiratory: Positive for shortness of breath (with exertion). Negative for cough and wheezing.   Cardiovascular: Positive for chest pain and orthopnea. Negative for palpitations.       Prior AAA- s/p stent graft Right fem - pop for popliteal aneurysm  Genitourinary:       Incontinence post prostatectomy Has prosthetic urinary sphincter  Musculoskeletal: Positive for back pain (prior back surgery).  All other systems reviewed and are negative.   Blood pressure 124/85, pulse 96, temperature 98.3 F (36.8 C), temperature source Oral, resp. rate 17, height 5\' 6"  (1.676 m), weight 280 lb 9.6 oz (127.279 kg), SpO2 95.00%. Physical Exam  Vitals reviewed. Constitutional: He is oriented to person, place, and time. No distress.  Morbidly obese  HENT:  Head: Normocephalic and atraumatic.  Eyes:  EOM are normal. Pupils are equal, round, and reactive to light.  Neck: Neck supple. No thyromegaly present.  No carotid bruit  Cardiovascular: Normal rate, regular rhythm and normal heart sounds.  Exam reveals no gallop and no friction rub.   No murmur heard. Respiratory: Effort normal and breath sounds normal. He has no wheezes. He has no rales.  GI: Soft. There is no tenderness.  Musculoskeletal: He exhibits no edema.  Well healed surgical scar right leg   Lymphadenopathy:    He has no cervical adenopathy.  Neurological: He is alert and oriented to person, place, and time. No cranial nerve deficit.  Motor intact  Skin: Skin is warm and dry.   CARDIAC CATHETERIZATION Procedural Findings:  Hemodynamics:  AO: 147/86 mmHg  LV: 146/18 mmHg  LVEDP: 32 mmHg  Coronary angiography:  Coronary dominance: Right  Left Main: Large in size with no significant disease.  Left Anterior Descending (LAD): Normal in size with an 80-90% proximal stenosis  followed by a large aneurysmal segment which is followed by a 20% stenosis. In the midsegment, there is 40% tubular stenosis inside the previously placed stent. The distal LAD has minor irregularities.  1st diagonal (D1): Small in size with minor irregularities.  2nd diagonal (D2): Normal in size with 20% ostial stenosis.  3rd diagonal (D3): Normal in size with minor irregularities.  Circumflex (LCx): Normal in size and nondominant. The vessel is occluded in the midsegment at the site of the previously placed stent.  1st obtuse marginal: Small in size with no significant disease.  2nd obtuse marginal: Normal in size with 95% ostial stenosis. The distal vessel is underfilling but there might be diffuse 60% disease in the midsegment.  Right Coronary Artery: Large in size and dominant. A stent is noted proximally with mild 20% in-stent restenosis. There is diffuse 30% disease in the midsegment. There is 95% stenosis distally just between 2 aneurysmal  segments.  Posterior descending artery: Large in size with minor irregularities.  Posterior AV segment: Normal in size with no significant disease.  Posterolateral branchs: Large in size with no significant disease. These gets faint collaterals to the distal left circumflex distribution. Left ventriculography: Left ventricular systolic function is normal , LVEF is estimated at 55 %, there is no significant mitral regurgitation  Final Conclusions:  1. Significant three-vessel coronary artery disease with large aneurysmal segment in the proximal LAD as well as aneurysmal segments in RCA.  2. Normal LV systolic function.  3. Moderately to severely elevated left ventricular end-diastolic pressure.    P2Y12= 261   Assessment/Plan: 72 yo male with multiple CRF and known CAD presents with a NQWMI. At catheterization he has severe 3 vessel CAD. CABG is indicated for survival benefit and relief of symptoms.   He has been on plavix but his P2Y12 shows adequate platelet function to safely proceed. Given that he has continued to have CP I think it is in his best interests to proceed with CABG tomorrow.  I discussed the general nature of the procedure, need for general anesthesia, incisions to be used with Mr. & Mrs. Borg. We discussed the expected hospital stay, overall recovery and short and long term outcomes. We discussed the indications, risks, benefits and alternatives. They understand the risks include, but are not limited to, death, stroke, MI, DVT/PE, bleeding, possible need for transfusion, infections, cardiac arrhythmias, and other organ system dysfunction including respiratory, renal, or GI complications. They understand the possibility of unforeseeable complications.   He accepts the risks and agrees to proceed.  For OR in AM  He has a urethral sphincter prosthesis. We will ask Urology to see and assist with Foley placement   Kobyn Kray C 08/26/2012, 12:12 PM

## 2012-08-27 NOTE — Brief Op Note (Addendum)
08/25/2012 - 08/27/2012  11:57 AM  PATIENT:  Max Anderson  72 y.o. male  PRE-OPERATIVE DIAGNOSIS:  CAD  POST-OPERATIVE DIAGNOSIS:  Coronary Artery Disease  PROCEDURE:  Procedure(s):  CORONARY ARTERY BYPASS GRAFTING x 4 -LIMA to LAD -SEQUENTIAL SVG to OM 1 AND OM 2 -SVG to PDA  PLACEMENT OF RIGHT FEMORAL ARTERIAL LINE  ENDOSCOPIC SAPHENOUS VEIN HARVEST LEFT LEG  TRANSESOPHAGEAL ECHOCARDIOGRAM (TEE) (N/A)  SURGEON:  Surgeon(s) and Role:    * Melrose Nakayama, MD - Primary  PHYSICIAN ASSISTANT: Erin Barrett PA-C  ANESTHESIA:   general  EBL:  Total I/O In: 2700 [I.V.:2700] Out: 650 [Urine:650]  BLOOD ADMINISTERED:CELLSAVER  DRAINS: Left pleural chest tube, Mediastinal chest drains   LOCAL MEDICATIONS USED:  NONE  SPECIMEN:  No Specimen  DISPOSITION OF SPECIMEN:  N/A  COUNTS:  YES   PLAN OF CARE: Admit to inpatient   PATIENT DISPOSITION:  ICU - intubated and hemodynamically stable.   Delay start of Pharmacological VTE agent (>24hrs) due to surgical blood loss or risk of bleeding: yes   Difficult technically due to morbid obesity Vein fair, LIMA good OM1 fair remaining targets good

## 2012-08-28 ENCOUNTER — Inpatient Hospital Stay (HOSPITAL_COMMUNITY): Payer: Medicare Other

## 2012-08-28 LAB — BASIC METABOLIC PANEL
CO2: 23 mEq/L (ref 19–32)
Calcium: 8.6 mg/dL (ref 8.4–10.5)
Creatinine, Ser: 1.43 mg/dL — ABNORMAL HIGH (ref 0.50–1.35)
GFR calc Af Amer: 55 mL/min — ABNORMAL LOW (ref >90)
GFR calc non Af Amer: 48 mL/min — ABNORMAL LOW (ref >90)
Sodium: 135 mEq/L (ref 135–145)

## 2012-08-28 LAB — CBC
Hemoglobin: 9.4 g/dL — ABNORMAL LOW (ref 13.0–17.0)
MCH: 30.5 pg (ref 26.0–34.0)
MCH: 30.9 pg (ref 26.0–34.0)
MCHC: 33.6 g/dL (ref 30.0–36.0)
Platelets: 142 10*3/uL — ABNORMAL LOW (ref 150–400)
Platelets: 128 10*3/uL — ABNORMAL LOW (ref 150–400)
RBC: 3.28 MIL/uL — ABNORMAL LOW (ref 4.22–5.81)
RBC: 3.04 MIL/uL — ABNORMAL LOW (ref 4.22–5.81)
RDW: 15.8 % — ABNORMAL HIGH (ref 11.5–15.5)

## 2012-08-28 LAB — GLUCOSE, CAPILLARY
Glucose-Capillary: 111 mg/dL — ABNORMAL HIGH (ref 70–99)
Glucose-Capillary: 146 mg/dL — ABNORMAL HIGH (ref 70–99)
Glucose-Capillary: 173 mg/dL — ABNORMAL HIGH (ref 70–99)
Glucose-Capillary: 184 mg/dL — ABNORMAL HIGH (ref 70–99)
Glucose-Capillary: 240 mg/dL — ABNORMAL HIGH (ref 70–99)
Glucose-Capillary: 258 mg/dL — ABNORMAL HIGH (ref 70–99)
Glucose-Capillary: 72 mg/dL (ref 70–99)
Glucose-Capillary: 85 mg/dL (ref 70–99)
Glucose-Capillary: 173 mg/dL — ABNORMAL HIGH (ref 70–99)

## 2012-08-28 LAB — POCT I-STAT 3, ART BLOOD GAS (G3+)
Acid-base deficit: 2 mmol/L (ref 0.0–2.0)
Bicarbonate: 20.8 mEq/L (ref 20.0–24.0)
Bicarbonate: 21 mEq/L (ref 20.0–24.0)
O2 Saturation: 94 %
Patient temperature: 37.9
Patient temperature: 37.9
Patient temperature: 37.9
TCO2: 22 mmol/L (ref 0–100)
TCO2: 22 mmol/L (ref 0–100)
pCO2 arterial: 35.9 mmHg (ref 35.0–45.0)
pCO2 arterial: 37.7 mmHg (ref 35.0–45.0)
pCO2 arterial: 38.8 mmHg (ref 35.0–45.0)
pH, Arterial: 7.376 (ref 7.350–7.450)
pH, Arterial: 7.357 (ref 7.350–7.450)
pH, Arterial: 7.377 (ref 7.350–7.450)

## 2012-08-28 LAB — CREATININE, SERUM
Creatinine, Ser: 1.47 mg/dL — ABNORMAL HIGH (ref 0.50–1.35)
GFR calc non Af Amer: 46 mL/min — ABNORMAL LOW (ref >90)

## 2012-08-28 LAB — POCT I-STAT, CHEM 8
Calcium, Ion: 1.16 mmol/L (ref 1.13–1.30)
Creatinine, Ser: 1.6 mg/dL — ABNORMAL HIGH (ref 0.50–1.35)
Hemoglobin: 9.5 g/dL — ABNORMAL LOW (ref 13.0–17.0)
Sodium: 139 mEq/L (ref 135–145)
TCO2: 23 mmol/L (ref 0–100)

## 2012-08-28 LAB — MAGNESIUM
Magnesium: 2.5 mg/dL (ref 1.5–2.5)
Magnesium: 2.4 mg/dL (ref 1.5–2.5)

## 2012-08-28 MED ORDER — ENOXAPARIN SODIUM 30 MG/0.3ML ~~LOC~~ SOLN
40.0000 mg | SUBCUTANEOUS | Status: DC
  Administered 2012-08-28: 40 mg via SUBCUTANEOUS
  Filled 2012-08-28 (×2): qty 0.4

## 2012-08-28 MED ORDER — ALBUMIN HUMAN 5 % IV SOLN
12.5000 g | Freq: Once | INTRAVENOUS | Status: AC
  Administered 2012-08-28: 12.5 g via INTRAVENOUS
  Filled 2012-08-28: qty 250

## 2012-08-28 MED ORDER — FUROSEMIDE 10 MG/ML IJ SOLN
40.0000 mg | Freq: Once | INTRAMUSCULAR | Status: AC
  Administered 2012-08-28: 40 mg via INTRAVENOUS
  Filled 2012-08-28: qty 4

## 2012-08-28 MED ORDER — INSULIN DETEMIR 100 UNIT/ML ~~LOC~~ SOLN
20.0000 [IU] | Freq: Every day | SUBCUTANEOUS | Status: DC
  Administered 2012-08-28 – 2012-08-30 (×3): 20 [IU] via SUBCUTANEOUS
  Filled 2012-08-28 (×4): qty 0.2

## 2012-08-28 MED ORDER — FUROSEMIDE 10 MG/ML IJ SOLN
40.0000 mg | Freq: Once | INTRAMUSCULAR | Status: AC
  Administered 2012-08-28: 40 mg via INTRAVENOUS

## 2012-08-28 MED ORDER — INSULIN ASPART 100 UNIT/ML ~~LOC~~ SOLN
0.0000 [IU] | SUBCUTANEOUS | Status: DC
  Administered 2012-08-28 – 2012-08-29 (×2): 4 [IU] via SUBCUTANEOUS
  Administered 2012-08-29: 2 [IU] via SUBCUTANEOUS
  Administered 2012-08-29: 4 [IU] via SUBCUTANEOUS

## 2012-08-28 NOTE — Progress Notes (Signed)
Dr Roxan Hockey notified of pt sats 82-84% on 40%, and ABG results.  MD states to increase fio2 to 50% and continue to wean vent per SICU protoocl and place pt on CPAP=10 PS, 5 peep.

## 2012-08-28 NOTE — Progress Notes (Signed)
Pt is weaning on vent per rapid wean protocol. Before transition to PS- pt's sats were noted to be 82-83%. An abg was obtained and Po2 was 47 and saturation was 83. MD Roxan Hockey made aware- orders received to wean on PS with an FiO2 of 50 and repeat ABG after 66min on PS  Will monitor  Lorre Munroe

## 2012-08-28 NOTE — Progress Notes (Signed)
1/2 bottle precedex wasted down sink and witnessed by Sam hardy RN  Lorre Munroe

## 2012-08-28 NOTE — Progress Notes (Signed)
F/u w/ pt to eval resp status.  Pt slightly agitated, pt states he is agitated d/t pain, and he also wants to sit up in chair.  RN explained that he is not able to sit in chair d/t femoral a-line recently removed.  Pt is alert and oriented, answers questions appropriately, no distress noted.  Sats 88% on 50% VM.  BBSH coarse t/o, w/ basilar crackles.

## 2012-08-28 NOTE — Progress Notes (Signed)
Weaned fio2 to 40%, Dr. Roxan Hockey okay w/ sats 88% or above.

## 2012-08-28 NOTE — Progress Notes (Signed)
1 Day Post-Op Procedure(s) (LRB): CORONARY ARTERY BYPASS GRAFTING times four using Left Greater Saphenous Vein Graft harvested endoscopically and Left Internal Mammary Artery (N/A) TRANSESOPHAGEAL ECHOCARDIOGRAM (TEE) (N/A) Subjective: Intubated Responds to commands, moves all extremities Nods to question "do you have any pain?"  Objective: Vital signs in last 24 hours: Temp:  [96.6 F (35.9 C)-100.2 F (37.9 C)] 100 F (37.8 C) (07/19 0715) Pulse Rate:  [86-92] 90 (07/19 0715) Cardiac Rhythm:  [-] A-V Sequential paced (07/19 0400) Resp:  [0-37] 15 (07/19 0715) BP: (71-117)/(25-73) 117/61 mmHg (07/19 0436) SpO2:  [89 %-99 %] 95 % (07/19 0715) Arterial Line BP: (61-149)/(35-84) 78/46 mmHg (07/19 0715) FiO2 (%):  [49.5 %-100 %] 60.5 % (07/19 0715) Weight:  [300 lb 11.3 oz (136.4 kg)] 300 lb 11.3 oz (136.4 kg) (07/19 0700)  Hemodynamic parameters for last 24 hours: PAP: (25-47)/(14-31) 27/18 mmHg CO:  [2.7 L/min-6.4 L/min] 4.6 L/min CI:  [1.2 L/min/m2-2.8 L/min/m2] 2.2 L/min/m2  Intake/Output from previous day: 07/18 0701 - 07/19 0700 In: 9877.9 [I.V.:6377.9; Blood:600; NG/GT:30; IV [id number] Out: [id number] [Urine:2385; Emesis/NG output:550; Blood:1200; Chest Tube:200] Intake/Output this shift:    General appearance: cooperative Heart: regular rate and rhythm Lungs: clear to auscultation bilaterally Abdomen: normal findings: soft, non-tender  Lab Results:  Recent Labs  08/27/12 2000 08/27/12 2003 08/28/12 0345  WBC 14.7*  --  14.4*  HGB 10.1* 10.2* 10.0*  HCT 30.0* 30.0* 30.0*  PLT 152  --  142*   BMET:  Recent Labs  08/27/12 0045  08/27/12 2003 08/28/12 0345  NA 133*  < > 138 135  K 4.0  < > 5.0 4.4  CL 97  --  104 104  CO2 28  --   --  23  GLUCOSE 286*  < > 218* 141*  BUN 18  --  18 19  CREATININE 1.13  < > 1.20 1.43*  CALCIUM 9.0  --   --  8.6  < > = values in this interval not displayed.  PT/INR:  Recent Labs  08/27/12 1441  LABPROT 16.3*  INR  1.34   ABG    Component Value Date/Time   PHART 7.389 08/28/2012 0345   HCO3 21.9 08/28/2012 0345   TCO2 23 08/28/2012 0345   ACIDBASEDEF 2.0 08/28/2012 0345   O2SAT 94.0 08/28/2012 0345   CBG (last 3)   Recent Labs  08/28/12 08/28/12 0056 08/28/12 0201  GLUCAP 184* 173* 146*    Assessment/Plan: S/P Procedure(s) (LRB): CORONARY ARTERY BYPASS GRAFTING times four using Left Greater Saphenous Vein Graft harvested endoscopically and Left Internal Mammary Artery (N/A) TRANSESOPHAGEAL ECHOCARDIOGRAM (TEE) (N/A) POD # 1 CABG CV- hemodynamics stable, dc swan after extubation  RESP_ still intubated, relatively high Aa gradient- wean to extubate  RENAL- creatinine up to 1.4, continue dopamine for today  Diurese for volume overload  ENDO- hyperglycemia improved- continue insulin gtt  ANEMIA secondary to ABL- mild will follow  SCD for DVT prophylaxis, will add lovenox   LOS: 3 days    HENDRICKSON,STEVEN C 08/28/2012

## 2012-08-28 NOTE — Progress Notes (Signed)
PM ROUNDS  Up in chair  Still on ventimask  BP 121/97  Pulse 92  Temp(Src) 98.6 F (37 C) (Oral)  Resp 20  Ht 5\' 6"  (1.676 m)  Wt 300 lb 11.3 oz (136.4 kg)  BMI 48.56 kg/m2  SpO2 80%  91% sat on 50% VM   Intake/Output Summary (Last 24 hours) at 08/28/12 1802 Last data filed at 08/28/12 1700  Gross per 24 hour  Intake 2966.8 ml  Output   2510 ml  Net  456.8 ml    Respiratory status still marginal but improving slowly  Renal function stable  Continue diuresis

## 2012-08-28 NOTE — Procedures (Signed)
Extubation Procedure Note  Patient Details:   Name: Max Anderson DOB: Dec 26, 1940 MRN: [id number]   Airway Documentation:     Evaluation  O2 sats: stable throughout Complications: No apparent complications Patient did tolerate procedure well. Bilateral Breath Sounds: Clear;Diminished Suctioning: Airway  Dr. Roxan Hockey notified of ABG and weaning parameters, ordered to extubate pt to 50% VM, and may increase pt to NRB, or BiPaP if needed to maintain pt sats greater than 88%  + cuff leak noted.  NIF - 54, VC 900 ml.  Yes, pt able to speak. No distress noted, no stridor noted.  BBSH w/ crackles.  No apparent complications noted immediatly post extubation.   Lenna Sciara 08/28/2012, 11:12 AM

## 2012-08-28 NOTE — Progress Notes (Signed)
Pt still lethargic after extubation. 1 hour post abg gas is similar to that of previous ones drawn. MD Roxan Hockey at bedside. Holding PO meds at this time until patient becomes less groggy. Tolerating ice chips at this time and is alet to self, place and situation. Will monitor  Max Anderson

## 2012-08-28 NOTE — Progress Notes (Signed)
Unable to give further pain med relief until extubated. Sats remain in 80's during rapid wean and dont want further pain med to interfere with wean- MD hendrickson aware

## 2012-08-29 ENCOUNTER — Inpatient Hospital Stay (HOSPITAL_COMMUNITY): Payer: Medicare Other

## 2012-08-29 LAB — POCT I-STAT 3, ART BLOOD GAS (G3+)
Bicarbonate: 24.7 mEq/L — ABNORMAL HIGH (ref 20.0–24.0)
O2 Saturation: 98 %
TCO2: 24 mmol/L (ref 0–100)
pCO2 arterial: 53.1 mmHg — ABNORMAL HIGH (ref 35.0–45.0)
pCO2 arterial: 38.1 mmHg (ref 35.0–45.0)
pH, Arterial: 7.276 — ABNORMAL LOW (ref 7.350–7.450)
pO2, Arterial: 65 mmHg — ABNORMAL LOW (ref 80.0–100.0)
pO2, Arterial: 107 mmHg — ABNORMAL HIGH (ref 80.0–100.0)

## 2012-08-29 LAB — BASIC METABOLIC PANEL
BUN: 26 mg/dL — ABNORMAL HIGH (ref 6–23)
Calcium: 8.3 mg/dL — ABNORMAL LOW (ref 8.4–10.5)
GFR calc Af Amer: 45 mL/min — ABNORMAL LOW (ref >90)
GFR calc non Af Amer: 39 mL/min — ABNORMAL LOW (ref >90)
Glucose, Bld: 204 mg/dL — ABNORMAL HIGH (ref 70–99)
Potassium: 5 mEq/L (ref 3.5–5.1)
Sodium: 135 mEq/L (ref 135–145)

## 2012-08-29 LAB — GLUCOSE, CAPILLARY
Glucose-Capillary: 137 mg/dL — ABNORMAL HIGH (ref 70–99)
Glucose-Capillary: 135 mg/dL — ABNORMAL HIGH (ref 70–99)
Glucose-Capillary: 133 mg/dL — ABNORMAL HIGH (ref 70–99)
Glucose-Capillary: 140 mg/dL — ABNORMAL HIGH (ref 70–99)
Glucose-Capillary: 171 mg/dL — ABNORMAL HIGH (ref 70–99)
Glucose-Capillary: 112 mg/dL — ABNORMAL HIGH (ref 70–99)

## 2012-08-29 LAB — CBC
Hemoglobin: 8.9 g/dL — ABNORMAL LOW (ref 13.0–17.0)
MCH: 30.6 pg (ref 26.0–34.0)
MCHC: 33 g/dL (ref 30.0–36.0)
Platelets: 122 10*3/uL — ABNORMAL LOW (ref 150–400)

## 2012-08-29 MED ORDER — AMIODARONE HCL IN DEXTROSE 360-4.14 MG/200ML-% IV SOLN
INTRAVENOUS | Status: AC
  Administered 2012-08-29: 200 mL via INTRAVENOUS
  Filled 2012-08-29: qty 400

## 2012-08-29 MED ORDER — SODIUM CHLORIDE 0.9 % IV SOLN: INTRAVENOUS | Status: DC

## 2012-08-29 MED ORDER — INSULIN ASPART 100 UNIT/ML ~~LOC~~ SOLN
4.0000 [IU] | Freq: Three times a day (TID) | SUBCUTANEOUS | Status: DC
  Administered 2012-08-29 – 2012-08-30 (×4): 4 [IU] via SUBCUTANEOUS

## 2012-08-29 MED ORDER — INSULIN REGULAR BOLUS VIA INFUSION
0.0000 [IU] | Freq: Three times a day (TID) | INTRAVENOUS | Status: DC
  Filled 2012-08-29: qty 10

## 2012-08-29 MED ORDER — AMIODARONE HCL IN DEXTROSE 360-4.14 MG/200ML-% IV SOLN
30.0000 mg/h | INTRAVENOUS | Status: AC
  Administered 2012-08-29 – 2012-08-31 (×6): 30 mg/h via INTRAVENOUS
  Filled 2012-08-29 (×14): qty 200

## 2012-08-29 MED ORDER — METOPROLOL TARTRATE 25 MG PO TABS
25.0000 mg | ORAL_TABLET | Freq: Two times a day (BID) | ORAL | Status: DC
  Administered 2012-08-29 – 2012-08-30 (×4): 25 mg via ORAL
  Filled 2012-08-29 (×6): qty 1

## 2012-08-29 MED ORDER — FUROSEMIDE 10 MG/ML IJ SOLN: 40.0000 mg | Freq: Two times a day (BID) | INTRAMUSCULAR | Status: DC

## 2012-08-29 MED ORDER — ALBUTEROL SULFATE (5 MG/ML) 0.5% IN NEBU
2.5000 mg | INHALATION_SOLUTION | Freq: Four times a day (QID) | RESPIRATORY_TRACT | Status: DC
  Administered 2012-08-29: 2.5 mg via RESPIRATORY_TRACT
  Filled 2012-08-29: qty 0.5

## 2012-08-29 MED ORDER — LEVALBUTEROL HCL 0.63 MG/3ML IN NEBU
0.6300 mg | INHALATION_SOLUTION | Freq: Four times a day (QID) | RESPIRATORY_TRACT | Status: DC
  Administered 2012-08-29 – 2012-09-01 (×11): 0.63 mg via RESPIRATORY_TRACT
  Filled 2012-08-29 (×20): qty 3

## 2012-08-29 MED ORDER — DEXTROSE 50 % IV SOLN: 25.0000 mL | INTRAVENOUS | Status: DC | PRN

## 2012-08-29 MED ORDER — FUROSEMIDE 10 MG/ML IJ SOLN
80.0000 mg | Freq: Three times a day (TID) | INTRAMUSCULAR | Status: DC
  Administered 2012-08-29 – 2012-08-30 (×4): 80 mg via INTRAVENOUS
  Filled 2012-08-29 (×9): qty 8

## 2012-08-29 MED ORDER — METOPROLOL TARTRATE 25 MG/10 ML ORAL SUSPENSION
25.0000 mg | Freq: Two times a day (BID) | ORAL | Status: DC
  Filled 2012-08-29 (×6): qty 10

## 2012-08-29 MED ORDER — INSULIN ASPART 100 UNIT/ML ~~LOC~~ SOLN
0.0000 [IU] | Freq: Three times a day (TID) | SUBCUTANEOUS | Status: DC
  Administered 2012-08-29: 7 [IU] via SUBCUTANEOUS
  Administered 2012-08-29 – 2012-08-30 (×2): 4 [IU] via SUBCUTANEOUS
  Administered 2012-08-30 – 2012-08-31 (×3): 7 [IU] via SUBCUTANEOUS
  Administered 2012-08-31: 4 [IU] via SUBCUTANEOUS
  Administered 2012-08-31: 7 [IU] via SUBCUTANEOUS
  Administered 2012-09-01: 4 [IU] via SUBCUTANEOUS
  Administered 2012-09-01: 7 [IU] via SUBCUTANEOUS
  Administered 2012-09-02: 4 [IU] via SUBCUTANEOUS
  Administered 2012-09-02: 6 [IU] via SUBCUTANEOUS
  Administered 2012-09-02 – 2012-09-03 (×3): 4 [IU] via SUBCUTANEOUS
  Administered 2012-09-03 – 2012-09-04 (×2): 3 [IU] via SUBCUTANEOUS
  Administered 2012-09-04: 4 [IU] via SUBCUTANEOUS
  Administered 2012-09-04 – 2012-09-05 (×2): 3 [IU] via SUBCUTANEOUS
  Administered 2012-09-05: 4 [IU] via SUBCUTANEOUS
  Administered 2012-09-06 (×2): 3 [IU] via SUBCUTANEOUS
  Administered 2012-09-07: 4 [IU] via SUBCUTANEOUS

## 2012-08-29 MED ORDER — FUROSEMIDE 10 MG/ML IJ SOLN
40.0000 mg | Freq: Three times a day (TID) | INTRAMUSCULAR | Status: DC
  Administered 2012-08-29 (×2): 40 mg via INTRAVENOUS
  Filled 2012-08-29: qty 4

## 2012-08-29 MED ORDER — SODIUM CHLORIDE 0.9 % IV SOLN
INTRAVENOUS | Status: DC
  Filled 2012-08-29: qty 1

## 2012-08-29 MED ORDER — ALBUTEROL SULFATE (5 MG/ML) 0.5% IN NEBU: 2.5000 mg | INHALATION_SOLUTION | Freq: Four times a day (QID) | RESPIRATORY_TRACT | Status: DC

## 2012-08-29 MED ORDER — ENOXAPARIN SODIUM 40 MG/0.4ML ~~LOC~~ SOLN
40.0000 mg | SUBCUTANEOUS | Status: DC
  Administered 2012-08-29 – 2012-09-08 (×11): 40 mg via SUBCUTANEOUS
  Filled 2012-08-29 (×11): qty 0.4

## 2012-08-29 MED ORDER — AMIODARONE HCL IN DEXTROSE 360-4.14 MG/200ML-% IV SOLN
60.0000 mg/h | INTRAVENOUS | Status: AC
  Administered 2012-08-29: 60 mg/h via INTRAVENOUS
  Filled 2012-08-29: qty 200

## 2012-08-29 MED ORDER — INSULIN ASPART 100 UNIT/ML ~~LOC~~ SOLN
0.0000 [IU] | Freq: Every day | SUBCUTANEOUS | Status: DC
  Administered 2012-09-01: 2 [IU] via SUBCUTANEOUS

## 2012-08-29 MED ORDER — AMIODARONE LOAD VIA INFUSION
150.0000 mg | Freq: Once | INTRAVENOUS | Status: AC
  Administered 2012-08-29: 150 mg via INTRAVENOUS
  Filled 2012-08-29: qty 83.34

## 2012-08-29 NOTE — Progress Notes (Signed)
Pt went into afib with RVR asymptomatically in the 140-150's. MD Roxan Hockey paged to make aware- new amio orders received.   Max Anderson

## 2012-08-29 NOTE — Progress Notes (Signed)
PM ROUNDS  Went into a fib earlier- back in SR on amiodarone  Respiratory status and mental status are improved  BP 120/63  Pulse 74  Temp(Src) 98.3 F (36.8 C) (Oral)  Resp 15  Ht 5\' 6"  (1.676 m)  Wt 300 lb 7.8 oz (136.3 kg)  BMI 48.52 kg/m2  SpO2 95%  Intake/Output     07/19 0701 - 07/20 0700 07/20 0701 - 07/21 0700   I.V. (mL/kg) 706.5 (5.2) 352 (2.6)   Blood     Other     NG/GT     IV Piggyback 350    Total Intake(mL/kg) 1056.5 (7.8) 352 (2.6)   Urine (mL/kg/hr) 2015 (0.6) 565 (0.4)   Emesis/NG output 100 (0)    Blood     Chest Tube 100 (0)    Total Output 2215 565   Net -1158.5 -213         Poor response to lasix- increase lasix to 80 mg  Picture c/w SIRS- respiratory ARDS like reaction, renal insufficiency, cardiac arrhythmias

## 2012-08-29 NOTE — Progress Notes (Signed)
Pt is awake, alert, oriented.  No resp distress noted.  Pt denies SOB, RN request to attempt pt off BIPAP if pt able to tolerate d/t need for PO meds and plans to get pt to sit up in chair.    Pt placed on 15 lpm NRB mask, sats 95%, rr 17.  Pt denies SOB, no distress noted.

## 2012-08-29 NOTE — Progress Notes (Signed)
Pt is continually pulling and pushing up with arms and taking off leads. Pt and family educated and redirected on sternal precautions. Will monitor and reeducate as necessary.   Max Anderson

## 2012-08-29 NOTE — Progress Notes (Signed)
2 Days Post-Op Procedure(s) (LRB): CORONARY ARTERY BYPASS GRAFTING times four using Left Greater Saphenous Vein Graft harvested endoscopically and Left Internal Mammary Artery (N/A) TRANSESOPHAGEAL ECHOCARDIOGRAM (TEE) (N/A) Subjective: More lethargic this AM Some incisional pain  Objective: Vital signs in last 24 hours: Temp:  [97.9 F (36.6 Anderson)-100.4 F (38 Anderson)] 97.9 F (36.6 Anderson) (07/20 0834) Pulse Rate:  [77-99] 86 (07/20 0800) Cardiac Rhythm:  [-] Normal sinus rhythm (07/20 0800) Resp:  [13-26] 13 (07/20 0800) BP: (74-164)/(32-97) 154/71 mmHg (07/20 0800) SpO2:  [80 %-98 %] 95 % (07/20 0800) Arterial Line BP: (106-115)/(51-59) 110/51 mmHg (07/19 1200) FiO2 (%):  [49.9 %-60.5 %] 60 % (07/20 0845) Weight:  [300 lb 7.8 oz (136.3 kg)] 300 lb 7.8 oz (136.3 kg) (07/20 0600)  Hemodynamic parameters for last 24 hours: PAP: (36-38)/(22-24) 38/24 mmHg  Intake/Output from previous day: 07/19 0701 - 07/20 0700 In: 1056.5 [I.V.:706.5; IV Piggyback:350] Out: 2215 [Urine:2015; Emesis/NG output:100; Chest Tube:100] Intake/Output this shift: Total I/O In: 20 [I.V.:20] Out: 60 [Urine:60]  General appearance: less alert Neurologic: no foacal deficit Heart: regular rate and rhythm Lungs: diminished breath sounds bilaterally Abdomen: obese nontender  Lab Results:  Recent Labs  08/28/12 1632 08/29/12 0441  WBC 14.0* 11.4*  HGB 9.4* 8.9*  HCT 28.0* 27.0*  PLT 128* 122*   BMET:  Recent Labs  08/28/12 0345 08/28/12 1631 08/28/12 1632 08/29/12 0441  NA 135 139  --  135  K 4.4 4.3  --  5.0  CL 104 105  --  102  CO2 23  --   --  24  GLUCOSE 141* 98  --  204*  BUN 19 20  --  26*  CREATININE 1.43* 1.60* 1.47* 1.70*  CALCIUM 8.6  --   --  8.3*    PT/INR:  Recent Labs  08/27/12 1441  LABPROT 16.3*  INR 1.34   ABG    Component Value Date/Time   PHART 7.276* 08/29/2012 0444   HCO3 24.7* 08/29/2012 0444   TCO2 26 08/29/2012 0444   ACIDBASEDEF 2.0 08/29/2012 0444   O2SAT  89.0 08/29/2012 0444   CBG (last 3)   Recent Labs  08/29/12 0010 08/29/12 0442 08/29/12 0836  GLUCAP 198* 189* 191*    Assessment/Plan: S/P Procedure(s) (LRB): CORONARY ARTERY BYPASS GRAFTING times four using Left Greater Saphenous Vein Graft harvested endoscopically and Left Internal Mammary Artery (N/A) TRANSESOPHAGEAL ECHOCARDIOGRAM (TEE) (N/A) POD # 2  More lethargic this AM- pCO2 elevated  CV_ stable  RESP- on BIPAP currently, needs to improve pulmonary hygiene, will add flutter valve  RENAL- creatinine up slightly, diurese, continue dopamine  ENDO- CBG elevated, add meal coverage  SCD + lovenox for DVT prophylaxis   LOS: 4 days    Max Anderson 08/29/2012

## 2012-08-30 ENCOUNTER — Inpatient Hospital Stay (HOSPITAL_COMMUNITY): Payer: Medicare Other

## 2012-08-30 ENCOUNTER — Encounter (HOSPITAL_COMMUNITY): Payer: Self-pay | Admitting: Thoracic Surgery (Cardiothoracic Vascular Surgery)

## 2012-08-30 LAB — POCT I-STAT 3, ART BLOOD GAS (G3+)
Acid-base deficit: 4 mmol/L — ABNORMAL HIGH (ref 0.0–2.0)
Acid-base deficit: 1 mmol/L (ref 0.0–2.0)
Bicarbonate: 24.5 mEq/L — ABNORMAL HIGH (ref 20.0–24.0)
Bicarbonate: 20.7 mEq/L (ref 20.0–24.0)
O2 Saturation: 100 %
O2 Saturation: 100 %
Patient temperature: 97.8
pCO2 arterial: 49.8 mmHg — ABNORMAL HIGH (ref 35.0–45.0)
pCO2 arterial: 40.3 mmHg (ref 35.0–45.0)
pH, Arterial: 7.346 — ABNORMAL LOW (ref 7.350–7.450)
pH, Arterial: 7.398 (ref 7.350–7.450)
pH, Arterial: 7.532 — ABNORMAL HIGH (ref 7.350–7.450)
pO2, Arterial: 378 mmHg — ABNORMAL HIGH (ref 80.0–100.0)
pO2, Arterial: 147 mmHg — ABNORMAL HIGH (ref 80.0–100.0)

## 2012-08-30 LAB — BASIC METABOLIC PANEL
CO2: 24 mEq/L (ref 19–32)
Calcium: 8.4 mg/dL (ref 8.4–10.5)
Creatinine, Ser: 1.73 mg/dL — ABNORMAL HIGH (ref 0.50–1.35)
Glucose, Bld: 167 mg/dL — ABNORMAL HIGH (ref 70–99)

## 2012-08-30 LAB — POCT I-STAT 4, (NA,K, GLUC, HGB,HCT)
Glucose, Bld: 295 mg/dL — ABNORMAL HIGH (ref 70–99)
Glucose, Bld: 296 mg/dL — ABNORMAL HIGH (ref 70–99)
Glucose, Bld: 263 mg/dL — ABNORMAL HIGH (ref 70–99)
HCT: 41 % (ref 39.0–52.0)
HCT: 31 % — ABNORMAL LOW (ref 39.0–52.0)
HCT: 29 % — ABNORMAL LOW (ref 39.0–52.0)
Hemoglobin: 13.9 g/dL (ref 13.0–17.0)
Hemoglobin: 12.9 g/dL — ABNORMAL LOW (ref 13.0–17.0)
Hemoglobin: 9.2 g/dL — ABNORMAL LOW (ref 13.0–17.0)
Potassium: 3.9 mEq/L (ref 3.5–5.1)
Potassium: 3.9 mEq/L (ref 3.5–5.1)
Sodium: 135 mEq/L (ref 135–145)
Sodium: 136 mEq/L (ref 135–145)

## 2012-08-30 LAB — GLUCOSE, CAPILLARY
Glucose-Capillary: 138 mg/dL — ABNORMAL HIGH (ref 70–99)
Glucose-Capillary: 206 mg/dL — ABNORMAL HIGH (ref 70–99)

## 2012-08-30 LAB — CBC
Hemoglobin: 8.1 g/dL — ABNORMAL LOW (ref 13.0–17.0)
MCH: 30.5 pg (ref 26.0–34.0)
MCV: 92.5 fL (ref 78.0–100.0)
Platelets: 128 10*3/uL — ABNORMAL LOW (ref 150–400)
RBC: 2.66 MIL/uL — ABNORMAL LOW (ref 4.22–5.81)

## 2012-08-30 LAB — APTT: aPTT: 37 seconds (ref 24–37)

## 2012-08-30 MED ORDER — LEVALBUTEROL HCL 0.63 MG/3ML IN NEBU
0.6300 mg | INHALATION_SOLUTION | Freq: Once | RESPIRATORY_TRACT | Status: AC
  Administered 2012-08-30: 0.63 mg via RESPIRATORY_TRACT

## 2012-08-30 MED ORDER — PHENYLEPHRINE HCL 10 MG/ML IJ SOLN
0.0000 ug/min | INTRAVENOUS | Status: DC
  Filled 2012-08-30: qty 4

## 2012-08-30 MED ORDER — AMIODARONE LOAD VIA INFUSION
150.0000 mg | Freq: Once | INTRAVENOUS | Status: AC
  Administered 2012-08-30: 150 mg via INTRAVENOUS
  Filled 2012-08-30: qty 83.34

## 2012-08-30 MED FILL — Heparin Sodium (Porcine) Inj 1000 Unit/ML: INTRAMUSCULAR | Qty: 30 | Status: AC

## 2012-08-30 MED FILL — Magnesium Sulfate Inj 50%: INTRAMUSCULAR | Qty: 10 | Status: AC

## 2012-08-30 MED FILL — Sodium Chloride IV Soln 0.9%: INTRAVENOUS | Qty: 1000 | Status: AC

## 2012-08-30 MED FILL — Potassium Chloride Inj 2 mEq/ML: INTRAVENOUS | Qty: 40 | Status: AC

## 2012-08-30 NOTE — Progress Notes (Signed)
TCTS PM rounds  Nonproductive cough Serosanguinous drainage from sternal wound- culture taken Walked 50 feet  NSR

## 2012-08-30 NOTE — Progress Notes (Signed)
UR Completed.  Max Anderson [id number] 08/30/2012

## 2012-08-30 NOTE — Progress Notes (Signed)
3 Days Post-Op Procedure(s) (LRB): CORONARY ARTERY BYPASS GRAFTING times four using Left Greater Saphenous Vein Graft harvested endoscopically and Left Internal Mammary Artery (N/A) TRANSESOPHAGEAL ECHOCARDIOGRAM (TEE) (N/A) Subjective: Feels better this morning "not really" when asked about pain  Objective: Vital signs in last 24 hours: Temp:  [97.8 F (36.6 C)-98.4 F (36.9 C)] 98 F (36.7 C) (07/21 0400) Pulse Rate:  [43-126] 71 (07/21 0700) Cardiac Rhythm:  [-] Normal sinus rhythm (07/20 1945) Resp:  [8-21] 14 (07/21 0700) BP: (106-164)/(55-110) 137/60 mmHg (07/21 0600) SpO2:  [87 %-100 %] 95 % (07/21 0700) FiO2 (%):  [39.2 %-100 %] 42.7 % (07/21 0400) Weight:  [293 lb 14 oz (133.3 kg)] 293 lb 14 oz (133.3 kg) (07/21 0600)  Hemodynamic parameters for last 24 hours:    Intake/Output from previous day: 07/20 0701 - 07/21 0700 In: 1085.7 [P.O.:240; I.V.:845.7] Out: 1570 [Urine:1570] Intake/Output this shift:    General appearance: alert and no distress Neurologic: intact Heart: regular rate and rhythm Lungs: diminished breath sounds bibasilar and rales bibasilar Abdomen: obese, BS present but hypoactive Wound: minimal serous drainage  Lab Results:  Recent Labs  08/29/12 0441 08/30/12 0330  WBC 11.4* 8.8  HGB 8.9* 8.1*  HCT 27.0* 24.6*  PLT 122* 128*   BMET:  Recent Labs  08/29/12 0441 08/30/12 0330  NA 135 136  K 5.0 4.1  CL 102 101  CO2 24 24  GLUCOSE 204* 167*  BUN 26* 38*  CREATININE 1.70* 1.73*  CALCIUM 8.3* 8.4    PT/INR:  Recent Labs  08/27/12 1441  LABPROT 16.3*  INR 1.34   ABG    Component Value Date/Time   PHART 7.532* 08/30/2012 0339   HCO3 21.3 08/30/2012 0339   TCO2 22 08/30/2012 0339   ACIDBASEDEF 1.0 08/30/2012 0339   O2SAT 98.0 08/30/2012 0339   CBG (last 3)   Recent Labs  08/29/12 2154 08/29/12 2332 08/30/12 0347  GLUCAP 137* 149* 138*    Assessment/Plan: S/P Procedure(s) (LRB): CORONARY ARTERY BYPASS GRAFTING  times four using Left Greater Saphenous Vein Graft harvested endoscopically and Left Internal Mammary Artery (N/A) TRANSESOPHAGEAL ECHOCARDIOGRAM (TEE) (N/A) POD # 3 CABG  CV- stable  In SR on amiodarone  RESP- hypoxemia improving- continue diuresis, IS  RENAL- creatinine stable, better response to lasix overnight- continue diuresis  ENDO- DM type II- CBG well controlled  OOB  Lovenox + SCD for DVT prophylaxis   LOS: 5 days    HENDRICKSON,STEVEN C 08/30/2012

## 2012-08-30 NOTE — Progress Notes (Deleted)
UR Completed.  Max Anderson [id number] 08/30/2012

## 2012-08-31 LAB — GLUCOSE, CAPILLARY
Glucose-Capillary: 218 mg/dL — ABNORMAL HIGH (ref 70–99)
Glucose-Capillary: 103 mg/dL — ABNORMAL HIGH (ref 70–99)

## 2012-08-31 LAB — CBC
HCT: 25.5 % — ABNORMAL LOW (ref 39.0–52.0)
MCHC: 32.5 g/dL (ref 30.0–36.0)
MCV: 92.7 fL (ref 78.0–100.0)
Platelets: 161 10*3/uL (ref 150–400)
RDW: 16.5 % — ABNORMAL HIGH (ref 11.5–15.5)

## 2012-08-31 LAB — BASIC METABOLIC PANEL
BUN: 43 mg/dL — ABNORMAL HIGH (ref 6–23)
Calcium: 8.3 mg/dL — ABNORMAL LOW (ref 8.4–10.5)
Creatinine, Ser: 1.5 mg/dL — ABNORMAL HIGH (ref 0.50–1.35)
GFR calc Af Amer: 52 mL/min — ABNORMAL LOW (ref >90)
GFR calc non Af Amer: 45 mL/min — ABNORMAL LOW (ref >90)

## 2012-08-31 LAB — PREPARE RBC (CROSSMATCH)

## 2012-08-31 MED ORDER — FUROSEMIDE 10 MG/ML IJ SOLN
80.0000 mg | Freq: Every day | INTRAMUSCULAR | Status: DC
  Administered 2012-08-31 – 2012-09-01 (×2): 80 mg via INTRAVENOUS
  Filled 2012-08-31 (×3): qty 8

## 2012-08-31 MED ORDER — ATENOLOL 25 MG PO TABS
25.0000 mg | ORAL_TABLET | Freq: Every day | ORAL | Status: DC
  Administered 2012-08-31: 25 mg via ORAL
  Filled 2012-08-31 (×2): qty 1

## 2012-08-31 MED ORDER — PHENYLEPHRINE HCL 10 MG/ML IJ SOLN
30.0000 ug/min | INTRAVENOUS | Status: DC
  Filled 2012-08-31 (×2): qty 4

## 2012-08-31 MED ORDER — POTASSIUM CHLORIDE 10 MEQ/50ML IV SOLN
10.0000 meq | INTRAVENOUS | Status: AC | PRN
  Administered 2012-08-31 (×3): 10 meq via INTRAVENOUS
  Filled 2012-08-31: qty 150

## 2012-08-31 MED ORDER — DIGOXIN 0.25 MG/ML IJ SOLN
0.2500 mg | Freq: Every day | INTRAMUSCULAR | Status: DC
  Administered 2012-09-01 – 2012-09-02 (×2): 0.25 mg via INTRAVENOUS
  Filled 2012-08-31 (×7): qty 1

## 2012-08-31 MED ORDER — AMIODARONE IV BOLUS ONLY 150 MG/100ML
150.0000 mg | Freq: Once | INTRAVENOUS | Status: AC
  Administered 2012-08-31: 150 mg via INTRAVENOUS

## 2012-08-31 MED ORDER — CLONAZEPAM 0.5 MG PO TABS
0.5000 mg | ORAL_TABLET | Freq: Two times a day (BID) | ORAL | Status: DC | PRN
  Administered 2012-09-02 – 2012-09-06 (×2): 0.5 mg via ORAL
  Filled 2012-08-31 (×2): qty 1

## 2012-08-31 MED ORDER — DIGOXIN 0.25 MG/ML IJ SOLN
0.2500 mg | Freq: Once | INTRAMUSCULAR | Status: AC
  Administered 2012-08-31: 0.25 mg via INTRAVENOUS
  Filled 2012-08-31: qty 1

## 2012-08-31 MED ORDER — FUROSEMIDE 10 MG/ML IJ SOLN: 80.0000 mg | Freq: Once | INTRAMUSCULAR | Status: DC

## 2012-08-31 MED ORDER — INSULIN DETEMIR 100 UNIT/ML ~~LOC~~ SOLN
20.0000 [IU] | Freq: Two times a day (BID) | SUBCUTANEOUS | Status: DC
  Administered 2012-08-31 – 2012-09-01 (×4): 20 [IU] via SUBCUTANEOUS
  Filled 2012-08-31 (×6): qty 0.2

## 2012-08-31 MED ORDER — INSULIN ASPART 100 UNIT/ML ~~LOC~~ SOLN
6.0000 [IU] | Freq: Three times a day (TID) | SUBCUTANEOUS | Status: DC
  Administered 2012-08-31 – 2012-09-06 (×15): 6 [IU] via SUBCUTANEOUS

## 2012-08-31 MED FILL — Sodium Bicarbonate IV Soln 8.4%: INTRAVENOUS | Qty: 50 | Status: AC

## 2012-08-31 MED FILL — Electrolyte-R (PH 7.4) Solution: INTRAVENOUS | Qty: 4000 | Status: AC

## 2012-08-31 MED FILL — Sodium Chloride Irrigation Soln 0.9%: Qty: 3000 | Status: AC

## 2012-08-31 MED FILL — Mannitol IV Soln 20%: INTRAVENOUS | Qty: 500 | Status: AC

## 2012-08-31 MED FILL — Heparin Sodium (Porcine) Inj 1000 Unit/ML: INTRAMUSCULAR | Qty: 30 | Status: AC

## 2012-08-31 MED FILL — Lidocaine HCl IV Inj 20 MG/ML: INTRAVENOUS | Qty: 5 | Status: AC

## 2012-08-31 MED FILL — Sodium Chloride IV Soln 0.9%: INTRAVENOUS | Qty: 1000 | Status: AC

## 2012-08-31 NOTE — Progress Notes (Signed)
4 Days Post-Op Procedure(s) (LRB): CORONARY ARTERY BYPASS GRAFTING times four using Left Greater Saphenous Vein Graft harvested endoscopically and Left Internal Mammary Artery (N/A) TRANSESOPHAGEAL ECHOCARDIOGRAM (TEE) (N/A) Subjective: Feels well this AM Much more animated today Denies pain and nausea  Objective: Vital signs in last 24 hours: Temp:  [97.6 F (36.4 C)-98.8 F (37.1 C)] 98.2 F (36.8 C) (07/22 0800) Pulse Rate:  [70-123] 117 (07/22 0800) Cardiac Rhythm:  [-] Normal sinus rhythm (07/22 0445) Resp:  [13-21] 16 (07/22 0800) BP: (91-131)/(31-104) 99/63 mmHg (07/22 0800) SpO2:  [88 %-100 %] 100 % (07/22 0734) FiO2 (%):  [35 %-40 %] 35 % (07/21 2122) Weight:  [291 lb 14.2 oz (132.4 kg)] 291 lb 14.2 oz (132.4 kg) (07/22 0500)  Hemodynamic parameters for last 24 hours:    Intake/Output from previous day: 07/21 0701 - 07/22 0700 In: 1649.7 [P.O.:695; I.V.:904.7; IV Piggyback:50] Out: 3785 [Urine:3785] Intake/Output this shift: Total I/O In: 15 [Blood:15] Out: -   General appearance: alert, cooperative and no distress Neurologic: intact Heart: irregularly irregular rhythm Lungs: diminished breath sounds bibasilar Abdomen: obese, nontender, hypoactive BS Wound: serous drainage from top of sternal wound  Lab Results:  Recent Labs  08/30/12 0330 08/31/12 0400  WBC 8.8 7.3  HGB 8.1* 8.3*  HCT 24.6* 25.5*  PLT 128* 161   BMET:  Recent Labs  08/30/12 0330 08/31/12 0400  NA 136 136  K 4.1 3.6  CL 101 99  CO2 24 27  GLUCOSE 167* 216*  BUN 38* 43*  CREATININE 1.73* 1.50*  CALCIUM 8.4 8.3*    PT/INR: No results found for this basename: LABPROT, INR,  in the last 72 hours ABG    Component Value Date/Time   PHART 7.532* 08/30/2012 0339   HCO3 21.3 08/30/2012 0339   TCO2 22 08/30/2012 0339   ACIDBASEDEF 1.0 08/30/2012 0339   O2SAT 98.0 08/30/2012 0339   CBG (last 3)   Recent Labs  08/30/12 1125 08/30/12 1530 08/30/12 2154  GLUCAP 206* 192* 172*     Assessment/Plan: S/P Procedure(s) (LRB): CORONARY ARTERY BYPASS GRAFTING times four using Left Greater Saphenous Vein Graft harvested endoscopically and Left Internal Mammary Artery (N/A) TRANSESOPHAGEAL ECHOCARDIOGRAM (TEE) (N/A) POD # 4 CABG Looks much better today- more alert and interactive  CV- back in a fib with RVR, started on digoxin overnight- will rebolus with amiodarone and continue amiodarone gtt today  RESP_ oxygenation significantly better, down to [place] O2  RENAL- creatinine better and diuresing well- c/w resolving ATN  Anemia- being transfused  Thrombocytopenia- resolved  On SCD + Lovenox for DVT prophylaxis  Mobilize  CBG elevated- increase levemir and meal coverage  LOS: 6 days    Maddex Garlitz C 08/31/2012

## 2012-08-31 NOTE — Progress Notes (Signed)
Patient ID: Max Anderson, male   DOB: [birth year], 72 y.o.   MRN: [id number] EVENING ROUNDS NOTE :     [address]       [place] [address]             [phone]                 4 Days Post-Op Procedure(s) (LRB): CORONARY ARTERY BYPASS GRAFTING times four using Left Greater Saphenous Vein Graft harvested endoscopically and Left Internal Mammary Artery (N/A) TRANSESOPHAGEAL ECHOCARDIOGRAM (TEE) (N/A)  Total Length of Stay:  LOS: 6 days  BP 135/77  Pulse 75  Temp(Src) 98 F (36.7 C) (Oral)  Resp 20  Ht 5\' 6"  (1.676 m)  Wt 291 lb 14.2 oz (132.4 kg)  BMI 47.13 kg/m2  SpO2 90%  .Intake/Output     07/21 0701 - 07/22 0700 07/22 0701 - 07/23 0700   P.O. 695    I.V. (mL/kg) 941.4 (7.1) 347 (2.6)   Blood  365   IV Piggyback 50 100   Total Intake(mL/kg) 1686.4 (12.7) 812 (6.1)   Urine (mL/kg/hr) 3785 (1.2) 1985 (1.3)   Total Output 3785 1985   Net -2098.6 -1173          . sodium chloride 20 mL/hr at 08/27/12 1410  . sodium chloride 10 mL/hr at 08/27/12 1410  . sodium chloride 250 mL (08/28/12 0553)  . sodium chloride    . amiodarone (NEXTERONE PREMIX) 360 mg/200 mL dextrose 30 mg/hr (08/31/12 1200)  . dexmedetomidine Stopped (08/28/12 0900)  . DOPamine Stopped (08/29/12 1200)  . lactated ringers 20 mL/hr at 08/30/12 1800  . nitroGLYCERIN Stopped (08/27/12 1410)  . phenylephrine (NEO-SYNEPHRINE) Adult infusion       Lab Results  Component Value Date   WBC 7.3 08/31/2012   HGB 8.3* 08/31/2012   HCT 25.5* 08/31/2012   PLT 161 08/31/2012   GLUCOSE 216* 08/31/2012   CHOL 243* 08/26/2012   TRIG 690* 08/26/2012   HDL 24* 08/26/2012   LDLCALC UNABLE TO CALCULATE IF TRIGLYCERIDE OVER 400 mg/dL 08/26/2012   NA 136 08/31/2012   K 3.6 08/31/2012   CL 99 08/31/2012   CREATININE 1.50* 08/31/2012   BUN 43* 08/31/2012   CO2 27 08/31/2012   TSH 2.483 08/26/2012   INR 1.34 08/27/2012   HGBA1C 7.7* 08/26/2012   Stable day, did not walk much Small amt drainage upper  sternum, bone stable  Grace Isaac MD  Beeper [phone] Office [phone] 08/31/2012 6:23 PM

## 2012-08-31 NOTE — Progress Notes (Signed)
MD notified that patient is still A-fib RVR after amiodarone bolus.  Orders received for digoxin and a unit of PRBC's.  Unable to carry out orders fully d/t limited IV access.  IV team has been pagedx4 for PIV insertion.  Will give unit of blood as soon as possible.

## 2012-09-01 LAB — CBC
Hemoglobin: 9.7 g/dL — ABNORMAL LOW (ref 13.0–17.0)
MCH: 30.1 pg (ref 26.0–34.0)
MCHC: 32.7 g/dL (ref 30.0–36.0)
RDW: 16.3 % — ABNORMAL HIGH (ref 11.5–15.5)

## 2012-09-01 LAB — GLUCOSE, CAPILLARY

## 2012-09-01 LAB — BASIC METABOLIC PANEL
BUN: 34 mg/dL — ABNORMAL HIGH (ref 6–23)
Calcium: 8.7 mg/dL (ref 8.4–10.5)
GFR calc Af Amer: 71 mL/min — ABNORMAL LOW (ref >90)
GFR calc non Af Amer: 61 mL/min — ABNORMAL LOW (ref >90)
Glucose, Bld: 184 mg/dL — ABNORMAL HIGH (ref 70–99)
Sodium: 138 mEq/L (ref 135–145)

## 2012-09-01 LAB — TYPE AND SCREEN
ABO/RH(D): A POS
Antibody Screen: NEGATIVE
Unit division: 0

## 2012-09-01 MED ORDER — AMIODARONE HCL 200 MG PO TABS
400.0000 mg | ORAL_TABLET | Freq: Two times a day (BID) | ORAL | Status: DC
  Administered 2012-09-01 – 2012-09-07 (×14): 400 mg via ORAL
  Filled 2012-09-01 (×17): qty 2

## 2012-09-01 MED ORDER — LEVALBUTEROL HCL 0.63 MG/3ML IN NEBU: 0.6300 mg | INHALATION_SOLUTION | Freq: Four times a day (QID) | RESPIRATORY_TRACT | Status: DC | PRN

## 2012-09-01 MED ORDER — POTASSIUM CHLORIDE 10 MEQ/50ML IV SOLN
10.0000 meq | INTRAVENOUS | Status: AC
  Administered 2012-09-01 (×3): 10 meq via INTRAVENOUS
  Filled 2012-09-01: qty 150

## 2012-09-01 MED ORDER — ATENOLOL 50 MG PO TABS
50.0000 mg | ORAL_TABLET | Freq: Every day | ORAL | Status: DC
  Administered 2012-09-01 – 2012-09-08 (×8): 50 mg via ORAL
  Filled 2012-09-01 (×8): qty 1

## 2012-09-01 MED ORDER — POTASSIUM CHLORIDE 10 MEQ/50ML IV SOLN
10.0000 meq | INTRAVENOUS | Status: AC
  Administered 2012-09-01 (×2): 10 meq via INTRAVENOUS
  Filled 2012-09-01: qty 100

## 2012-09-01 NOTE — Evaluation (Signed)
Physical Therapy Evaluation Patient Details Name: KANDON PENNING MRN: [id number] DOB: [birth year] Today's Date: 09/01/2012 Time: [id number] PT Time Calculation (min): 25 min  PT Assessment / Plan / Recommendation History of Present Illness  S/P CABG  Clinical Impression  Patient demonstrates deficits in functional mobility as indicated. Patient will benefit from skilled PT to address deficits and maximize function.  Will continue to see as indicated. Rec DC to [facility] prior to return home to ensure safety and function with sternal precautions and mobility.  Will continue to see as indicated.    PT Assessment  Patient needs continued PT services    Follow Up Recommendations  SNF    Does the patient have the potential to tolerate intense rehabilitation      Barriers to Discharge        Equipment Recommendations  Rolling walker with 5" wheels    Recommendations for Other Services     Frequency Min 3X/week    Precautions / Restrictions Precautions Precautions: Sternal Restrictions Weight Bearing Restrictions: No   Pertinent Vitals/Pain 2/10, 3/4 DOE with activity      Mobility  Bed Mobility Bed Mobility: Supine to Sit;Sitting - Scoot to Edge of Bed Supine to Sit: 1: +2 Total assist Supine to Sit: Patient Percentage: 40% Sitting - Scoot to Edge of Bed: 3: Mod assist Details for Bed Mobility Assistance: VCs for sternal precautions Transfers Transfers: Sit to Stand;Stand to Sit Sit to Stand: 3: Mod assist Stand to Sit: 4: Min assist Details for Transfer Assistance: assist for stability and cues for sternal precautions Ambulation/Gait Ambulation/Gait Assistance: 4: Min assist Ambulation Distance (Feet): 50 Feet Assistive device: Rolling walker Ambulation/Gait Assistance Details: assist for stability Gait Pattern: Step-through pattern;Decreased stride length;Trunk flexed Gait velocity: decreased General Gait Details: limited by DOE    Exercises     PT Diagnosis:  Difficulty walking;Abnormality of gait;Generalized weakness;Acute pain  PT Problem List: Decreased strength;Decreased range of motion;Decreased activity tolerance;Decreased balance;Decreased mobility PT Treatment Interventions: DME instruction;Gait training;Stair training;Functional mobility training;Therapeutic activities;Therapeutic exercise;Patient/family education     PT Goals(Current goals can be found in the care plan section) Acute Rehab PT Goals Patient Stated Goal: to go to rehab close to home PT Goal Formulation: With patient Time For Goal Achievement: 09/15/12 Potential to Achieve Goals: Good  Visit Information  Last PT Received On: 09/01/12 Assistance Needed: +2 History of Present Illness: S/P CABG       Prior Functioning  Home Living Family/patient expects to be discharged to:: Private residence Living Arrangements: Spouse/significant other Available Help at Discharge: Friend(s) Type of Home: House Home Access: [place]: One level Prior Function Level of Independence: Independent Dominant Hand: Right    Cognition  Cognition Arousal/Alertness: Awake/alert Behavior During Therapy: WFL for tasks assessed/performed Overall Cognitive Status: Within Functional Limits for tasks assessed    Extremity/Trunk Assessment Upper Extremity Assessment Upper Extremity Assessment: Defer to OT evaluation Lower Extremity Assessment Lower Extremity Assessment: Generalized weakness   Balance    End of Session PT - End of Session Equipment Utilized During Treatment: Gait belt Activity Tolerance: Patient limited by fatigue Patient left: in chair;with call bell/phone within reach Nurse Communication: Mobility status  GP     [name] 09/01/2012, 4:04 PM [name], [place] DPT  [phone]

## 2012-09-01 NOTE — Progress Notes (Signed)
5 Days Post-Op Procedure(s) (LRB): CORONARY ARTERY BYPASS GRAFTING times four using Left Greater Saphenous Vein Graft harvested endoscopically and Left Internal Mammary Artery (N/A) TRANSESOPHAGEAL ECHOCARDIOGRAM (TEE) (N/A) Subjective: Feels better but c/o being weak Requiring 2-3 person assist  Objective: Vital signs in last 24 hours: Temp:  [97.7 F (36.5 C)-98.9 F (37.2 C)] 97.7 F (36.5 C) (07/23 0816) Pulse Rate:  [38-127] 78 (07/23 0700) Cardiac Rhythm:  [-] Normal sinus rhythm (07/22 2000) Resp:  [15-24] 24 (07/23 0700) BP: (68-143)/(35-84) 132/75 mmHg (07/23 0700) SpO2:  [87 %-100 %] 94 % (07/23 0814) Weight:  [290 lb 9.1 oz (131.8 kg)] 290 lb 9.1 oz (131.8 kg) (07/23 0500)  Hemodynamic parameters for last 24 hours:    Intake/Output from previous day: 07/22 0701 - 07/23 0700 In: 1865.8 [P.O.:540; I.V.:860.8; Blood:365; IV Piggyback:100] Out: [id number] [Urine:3210] Intake/Output this shift:    General appearance: alert and no distress Neurologic: intact Heart: regular rate and rhythm Lungs: diminished breath sounds bibasilar Abdomen: obese nontender Wound: serous drainage from superior portion of sternal incision  Lab Results:  Recent Labs  08/31/12 0400 09/01/12 0500  WBC 7.3 7.8  HGB 8.3* 9.7*  HCT 25.5* 29.7*  PLT 161 194   BMET:  Recent Labs  08/31/12 0400 09/01/12 0500  NA 136 138  K 3.6 3.4*  CL 99 100  CO2 27 26  GLUCOSE 216* 184*  BUN 43* 34*  CREATININE 1.50* 1.17  CALCIUM 8.3* 8.7    PT/INR: No results found for this basename: LABPROT, INR,  in the last 72 hours ABG    Component Value Date/Time   PHART 7.532* 08/30/2012 0339   HCO3 21.3 08/30/2012 0339   TCO2 22 08/30/2012 0339   ACIDBASEDEF 1.0 08/30/2012 0339   O2SAT 98.0 08/30/2012 0339   CBG (last 3)   Recent Labs  08/31/12 1721 08/31/12 2039 08/31/12 2204  GLUCAP 149* 103* 143*    Assessment/Plan: S/P Procedure(s) (LRB): CORONARY ARTERY BYPASS GRAFTING times four using  Left Greater Saphenous Vein Graft harvested endoscopically and Left Internal Mammary Artery (N/A) TRANSESOPHAGEAL ECHOCARDIOGRAM (TEE) (N/A) POD # 5 CV- intermittent a fib- increase atenolol, change amiodarone to PO  If continues to have bursts of a fib will need coumadin  RESP- ARDS improving  RENAL- ARF resolved, creatinine normal  Continue diuresis  Hypokalemia- supplement  ENDO- CBG well controlled  Deconditioning- PT consult, mobilize  DVT prophylaxis- SCD + enoxaparin   LOS: 7 days    HENDRICKSON,STEVEN C 09/01/2012

## 2012-09-01 NOTE — Progress Notes (Signed)
Patient ID: Max Anderson, male   DOB: 01/09/1941, 72 y.o.   MRN: [id number]  Called to see patient to remove foley and reactivated the artificial urinary sphincter.  His 21fr foley was removed.   I attempted to reactivate the sphincter but the bulb was too empty to reactivate.  I tried several times and thought I might have been successful, but will recheck tomorrow to make sure.  He reports that the sphincter wasn't working that well anyway.

## 2012-09-02 ENCOUNTER — Inpatient Hospital Stay (HOSPITAL_COMMUNITY): Payer: Medicare Other

## 2012-09-02 LAB — GLUCOSE, CAPILLARY
Glucose-Capillary: 184 mg/dL — ABNORMAL HIGH (ref 70–99)
Glucose-Capillary: 136 mg/dL — ABNORMAL HIGH (ref 70–99)

## 2012-09-02 LAB — BASIC METABOLIC PANEL
BUN: 36 mg/dL — ABNORMAL HIGH (ref 6–23)
GFR calc Af Amer: 68 mL/min — ABNORMAL LOW (ref >90)
GFR calc non Af Amer: 59 mL/min — ABNORMAL LOW (ref >90)
Potassium: 3.5 mEq/L (ref 3.5–5.1)
Sodium: 136 mEq/L (ref 135–145)

## 2012-09-02 LAB — CBC
HCT: 31.4 % — ABNORMAL LOW (ref 39.0–52.0)
MCHC: 33.1 g/dL (ref 30.0–36.0)
RDW: 16.3 % — ABNORMAL HIGH (ref 11.5–15.5)

## 2012-09-02 MED ORDER — MOVING RIGHT ALONG BOOK
Freq: Once | Status: AC
  Administered 2012-09-02: 13:00:00
  Filled 2012-09-02: qty 1

## 2012-09-02 MED ORDER — BISACODYL 5 MG PO TBEC: 10.0000 mg | DELAYED_RELEASE_TABLET | Freq: Every day | ORAL | Status: DC | PRN

## 2012-09-02 MED ORDER — SODIUM CHLORIDE 0.9 % IV SOLN: 250.0000 mL | INTRAVENOUS | Status: DC | PRN

## 2012-09-02 MED ORDER — BISACODYL 10 MG RE SUPP: 10.0000 mg | Freq: Every day | RECTAL | Status: DC | PRN

## 2012-09-02 MED ORDER — ACETAMINOPHEN 325 MG PO TABS
650.0000 mg | ORAL_TABLET | Freq: Four times a day (QID) | ORAL | Status: DC | PRN
  Administered 2012-09-02: 650 mg via ORAL
  Filled 2012-09-02: qty 2

## 2012-09-02 MED ORDER — ONDANSETRON HCL 4 MG/2ML IJ SOLN: 4.0000 mg | Freq: Four times a day (QID) | INTRAMUSCULAR | Status: DC | PRN

## 2012-09-02 MED ORDER — OXYCODONE HCL 5 MG PO TABS
5.0000 mg | ORAL_TABLET | ORAL | Status: DC | PRN
  Administered 2012-09-02 – 2012-09-07 (×7): 10 mg via ORAL
  Filled 2012-09-02 (×7): qty 2

## 2012-09-02 MED ORDER — ONDANSETRON HCL 4 MG PO TABS: 4.0000 mg | ORAL_TABLET | Freq: Four times a day (QID) | ORAL | Status: DC | PRN

## 2012-09-02 MED ORDER — ALUM & MAG HYDROXIDE-SIMETH 200-200-20 MG/5ML PO SUSP: 15.0000 mL | ORAL | Status: DC | PRN

## 2012-09-02 MED ORDER — DOCUSATE SODIUM 100 MG PO CAPS
200.0000 mg | ORAL_CAPSULE | Freq: Every day | ORAL | Status: DC
  Administered 2012-09-02 – 2012-09-06 (×5): 200 mg via ORAL
  Filled 2012-09-02 (×6): qty 2
  Filled 2012-09-02: qty 1

## 2012-09-02 MED ORDER — SODIUM CHLORIDE 0.9 % IJ SOLN
3.0000 mL | Freq: Two times a day (BID) | INTRAMUSCULAR | Status: DC
  Administered 2012-09-02 – 2012-09-06 (×7): 3 mL via INTRAVENOUS

## 2012-09-02 MED ORDER — INSULIN DETEMIR 100 UNIT/ML ~~LOC~~ SOLN
25.0000 [IU] | Freq: Two times a day (BID) | SUBCUTANEOUS | Status: DC
  Administered 2012-09-02 – 2012-09-03 (×4): 25 [IU] via SUBCUTANEOUS
  Filled 2012-09-02 (×6): qty 0.25

## 2012-09-02 MED ORDER — FUROSEMIDE 80 MG PO TABS
80.0000 mg | ORAL_TABLET | Freq: Every day | ORAL | Status: DC
  Administered 2012-09-02 – 2012-09-08 (×7): 80 mg via ORAL
  Filled 2012-09-02 (×7): qty 1

## 2012-09-02 MED ORDER — POTASSIUM CHLORIDE CRYS ER 20 MEQ PO TBCR
20.0000 meq | EXTENDED_RELEASE_TABLET | Freq: Every day | ORAL | Status: DC
  Administered 2012-09-02 – 2012-09-08 (×7): 20 meq via ORAL
  Filled 2012-09-02 (×7): qty 1

## 2012-09-02 MED ORDER — MAGNESIUM HYDROXIDE 400 MG/5ML PO SUSP: 30.0000 mL | Freq: Every day | ORAL | Status: DC | PRN

## 2012-09-02 MED ORDER — TRAMADOL HCL 50 MG PO TABS: 50.0000 mg | ORAL_TABLET | ORAL | Status: DC | PRN

## 2012-09-02 MED ORDER — SODIUM CHLORIDE 0.9 % IJ SOLN: 3.0000 mL | INTRAMUSCULAR | Status: DC | PRN

## 2012-09-02 MED ORDER — PANTOPRAZOLE SODIUM 40 MG PO TBEC
40.0000 mg | DELAYED_RELEASE_TABLET | Freq: Every day | ORAL | Status: DC
  Administered 2012-09-03 – 2012-09-08 (×6): 40 mg via ORAL
  Filled 2012-09-02 (×6): qty 1

## 2012-09-02 NOTE — Progress Notes (Signed)
CARDIAC REHAB PHASE I   PRE:  Rate/Rhythm: 65 SR    BP: sitting 112/66    SaO2: 94 4L  MODE:  Ambulation: to chair   POST:  SaO2: 98 4L  Upon entering room pt SOB in bed, congested. Also incontinent of urine. Sat pt up and able to expel phlegm, which helped. Pt refused to ambulate, stating he did not feel well and had walked x3 earlier. Encouraged pt to atleast move to recliner to sit up which pt obliged. Better breathing in recliner. Left on 3L with water to O2. Pt unsteady moving to recliner with RW. Will f/u in am.  [id number]   Josephina Shih [place] CES, ACSM 09/02/2012 3:14 PM

## 2012-09-02 NOTE — Progress Notes (Signed)
Pt transferred from unit 2300. On arrival pt alert and oriented, VSS, NAD, DSG c/d/i, skin intact, no c.o pain. Pts wife however stated that they did not bring his home CPAP and equipment with him during transfer. The RN that transferred the pt Mateo Flow was called and notified and will send to his new room. Will continue to monitor the pt.

## 2012-09-02 NOTE — Progress Notes (Signed)
A volunteer from unit 2300 delivered the pts CPAP machine but not his face mask/ head gear. Wife called to unit to remind them. Will wait on its arrival.

## 2012-09-02 NOTE — Progress Notes (Signed)
The RN from 2300 talked to the wife again about his face mask and head gear for his CPAP that was brought during transport from  2300 to 2041 and at this time the pts wife states that the nurse from 2300 told her that it must have been thrown away and that it is not there in his room or on the unit at this time. I notified the director of the unit who is going to follow up with this complaint.

## 2012-09-02 NOTE — Progress Notes (Signed)
Pt ambulated in the hallway 100 feet pushing a wheelchair with a standby assist. Gait slow but steady. Pt stopped once to rest. Tolerated well.  Vella Raring, RN

## 2012-09-02 NOTE — Progress Notes (Signed)
Pt transferred to room 2041 in wheelchair with belongings and wife by side. Report called to Janett Billow, RN ahead of time. Pt placed on telemetry monitor and assisted to bathroom. RN and wife in room.   Vella Raring, RN

## 2012-09-02 NOTE — Progress Notes (Signed)
T[facility] DAILY ICU PROGRESS NOTE                   [address]            [place] [address]          [phone]   6 Days Post-Op Procedure(s) (LRB): CORONARY ARTERY BYPASS GRAFTING times four using Left Greater Saphenous Vein Graft harvested endoscopically and Left Internal Mammary Artery (N/A) TRANSESOPHAGEAL ECHOCARDIOGRAM (TEE) (N/A)  Total Length of Stay:  LOS: 8 days   Subjective: Feels ok, some sternal incision discomfort  Objective: Vital signs in last 24 hours: Temp:  [97.6 F (36.4 C)-98.6 F (37 C)] 98.3 F (36.8 C) (07/24 0746) Pulse Rate:  [57-86] 75 (07/24 0700) Cardiac Rhythm:  [-] Normal sinus rhythm (07/24 0400) Resp:  [13-23] 18 (07/24 0700) BP: (98-141)/(45-99) 98/54 mmHg (07/24 0700) SpO2:  [89 %-99 %] 96 % (07/24 0700) Weight:  [287 lb 14.7 oz (130.6 kg)] 287 lb 14.7 oz (130.6 kg) (07/24 0500)  Filed Weights   08/31/12 0500 09/01/12 0500 09/02/12 0500  Weight: 291 lb 14.2 oz (132.4 kg) 290 lb 9.1 oz (131.8 kg) 287 lb 14.7 oz (130.6 kg)    Weight change: -2 lb 10.3 oz (-1.2 kg)   Hemodynamic parameters for last 24 hours:    Intake/Output from previous day: 07/23 0701 - 07/24 0700 In: 416.8 [P.O.:200; I.V.:166.8; IV Piggyback:50] Out: 1630 [Urine:1630]  Intake/Output this shift:    Current Meds: Scheduled Meds: . allopurinol  300 mg Oral Daily  . amiodarone  400 mg Oral BID  . aspirin EC  325 mg Oral Daily   Or  . aspirin  324 mg Per Tube Daily  . atenolol  50 mg Oral Daily  . bisacodyl  10 mg Oral Daily   Or  . bisacodyl  10 mg Rectal Daily  . buPROPion  300 mg Oral Daily  . digoxin  0.25 mg Intravenous Daily  . docusate sodium  200 mg Oral Daily  . enoxaparin (LOVENOX) injection  40 mg Subcutaneous Q24H  . furosemide  80 mg Intravenous Daily  . insulin aspart  0-20 Units Subcutaneous TID WC  . insulin aspart  0-5 Units Subcutaneous QHS  . insulin aspart  6 Units Subcutaneous TID WC  . insulin detemir  20 Units  Subcutaneous BID  . pantoprazole  40 mg Oral Daily  . PARoxetine  40 mg Oral Q0600  . sodium chloride  3 mL Intravenous Q12H   Continuous Infusions: . sodium chloride 20 mL/hr at 08/27/12 1410  . sodium chloride 10 mL/hr at 08/27/12 1410  . sodium chloride 250 mL (08/28/12 0553)  . sodium chloride    . dexmedetomidine Stopped (08/28/12 0900)  . DOPamine Stopped (08/29/12 1200)  . lactated ringers 20 mL/hr at 08/30/12 1800  . nitroGLYCERIN Stopped (08/27/12 1410)  . phenylephrine (NEO-SYNEPHRINE) Adult infusion     PRN Meds:.clonazePAM, dextrose, levalbuterol, metoprolol, morphine injection, ondansetron (ZOFRAN) IV, oxyCODONE, sodium chloride  General appearance: alert, cooperative and no distress Heart: regular rate and rhythm Lungs: mildly dim in bases Abdomen: soft, nontender Extremities: + BLE edema Wound: small amt of slightly bloody drainage from sternal incision  Lab Results: CBC: Recent Labs  09/01/12 0500 09/02/12 0350  WBC 7.8 7.2  HGB 9.7* 10.4*  HCT 29.7* 31.4*  PLT 194 236   BMET:  Recent Labs  09/01/12 0500 09/02/12 0350  NA 138 136  K 3.4* 3.5  CL 100 99  CO2  26 29  GLUCOSE 184* 171*  BUN 34* 36*  CREATININE 1.17 1.20  CALCIUM 8.7 8.6    PT/INR: No results found for this basename: LABPROT, INR,  in the last 72 hours Radiology: Dg Chest Port 1 View  09/02/2012   *RADIOLOGY REPORT*  Clinical Data: Status post CABG  PORTABLE CHEST - 1 VIEW  Comparison: 08/30/2012  Findings: Aeration of the left lower lobe is improved with some residual mild atelectasis remaining.  No edema, pneumothorax or significant pleural effusion is identified.  The heart size and mediastinal contours are stable.  IMPRESSION: Improved aeration of the left lower lobe.  No pneumothorax.   Original Report Authenticated By: Aletta Edouard, M.D.     Assessment/Plan: S/P Procedure(s) (LRB): CORONARY ARTERY BYPASS GRAFTING times four using Left Greater Saphenous Vein Graft harvested  endoscopically and Left Internal Mammary Artery (N/A) TRANSESOPHAGEAL ECHOCARDIOGRAM (TEE) (N/A)  1 steady overall improvement 2 CXR /pulm status improving - wean O2 as able, cont pulm toilet 3 renal status improved, change to po lasix 4 cbg's -some readings in 200's, increase insulin 5 ? Fatty necrosis with some sternal drainage, no leukocytosis, afebrile, sternum is stable without movement/click- monitor  6 push rehab 7 maintaining NSR on amio/ dig- check dig level 8 H/H stable 9 tx to [place] E 09/02/2012 8:08 AM

## 2012-09-02 NOTE — Progress Notes (Signed)
Patient ID: Max Anderson, male   DOB: [birth year], 72 y.o.   MRN: [id number]  I came back to check to see if the sphincter was active, but he is leaking severely.    I successfully activated the sphincter this morning after discussing the technique with Dr. Matilde Sprang since my initial effort was unsuccessful because his pump bulb was too empty to deactivate.

## 2012-09-03 LAB — GLUCOSE, CAPILLARY
Glucose-Capillary: 143 mg/dL — ABNORMAL HIGH (ref 70–99)
Glucose-Capillary: 113 mg/dL — ABNORMAL HIGH (ref 70–99)

## 2012-09-03 LAB — BASIC METABOLIC PANEL
BUN: 35 mg/dL — ABNORMAL HIGH (ref 6–23)
Calcium: 8.5 mg/dL (ref 8.4–10.5)
GFR calc Af Amer: 70 mL/min — ABNORMAL LOW (ref >90)
GFR calc non Af Amer: 60 mL/min — ABNORMAL LOW (ref >90)
Glucose, Bld: 149 mg/dL — ABNORMAL HIGH (ref 70–99)
Sodium: 137 mEq/L (ref 135–145)

## 2012-09-03 LAB — CBC
Hemoglobin: 10 g/dL — ABNORMAL LOW (ref 13.0–17.0)
MCH: 30 pg (ref 26.0–34.0)
MCHC: 32.1 g/dL (ref 30.0–36.0)
RDW: 16 % — ABNORMAL HIGH (ref 11.5–15.5)

## 2012-09-03 LAB — WOUND CULTURE: Culture: NO GROWTH

## 2012-09-03 MED ORDER — ASPIRIN EC 325 MG PO TBEC
325.0000 mg | DELAYED_RELEASE_TABLET | Freq: Every day | ORAL | Status: DC
  Administered 2012-09-03 – 2012-09-08 (×6): 325 mg via ORAL
  Filled 2012-09-03 (×6): qty 1

## 2012-09-03 MED ORDER — DIGOXIN 250 MCG PO TABS
0.2500 mg | ORAL_TABLET | Freq: Every day | ORAL | Status: DC
  Administered 2012-09-03 – 2012-09-07 (×5): 0.25 mg via ORAL
  Filled 2012-09-03 (×6): qty 1

## 2012-09-03 NOTE — Progress Notes (Signed)
Occupational Therapy Evaluation Patient Details Name: Max Anderson MRN: [id number] DOB: 09-Sep-1940 Today's Date: 09/03/2012 Time: [id number] OT Time Calculation (min): 29 min  OT Assessment / Plan / Recommendation History of present illness S/P CABG   Clinical Impression   Pt with functional decline s/p CABG. Pt has available resources to care for pt at home. Discussed DME needs and home set up for increase safety and adherence to sternal precautions. Educated pt/wife on sternal precautions. Wife able to demonstrate being able to assist pt with bed mobility.  Pt will benefit from skilled OT services to facilitate D/C to next venue due to below deficits.    OT Assessment  Patient needs continued OT Services    Follow Up Recommendations  Home health OT;Other (comment) [facility])    Barriers to Discharge      Equipment Recommendations  3 in 1 bedside comode;Tub/shower bench;Other (comment) (wife chaecking to see if she has this equipment)    Recommendations for Other Services    Frequency  Min 2X/week    Precautions / Restrictions Precautions Precautions: Sternal Restrictions Weight Bearing Restrictions: No   Pertinent Vitals/Pain no apparent distress     ADL  Grooming: Set up;Supervision/safety Where Assessed - Grooming: Unsupported sitting Upper Body Bathing: Set up;Supervision/safety Where Assessed - Upper Body Bathing: Unsupported sitting Lower Body Bathing: Moderate assistance Where Assessed - Lower Body Bathing: Supported sit to stand Upper Body Dressing: Minimal assistance Where Assessed - Upper Body Dressing: Unsupported sitting Lower Body Dressing: Moderate assistance Where Assessed - Lower Body Dressing: Supported sit to Lobbyist: Minimal Print production planner Method: Sit to Loss adjuster, chartered: Bedside commode Equipment Used: Gait belt;Rolling walker Transfers/Ambulation Related to ADLs: min A ADL Comments: wife assisting with  ADL    OT Diagnosis: Generalized weakness;Acute pain  OT Problem List: Decreased strength;Decreased activity tolerance;Decreased knowledge of use of DME or AE;Decreased knowledge of precautions;Cardiopulmonary status limiting activity;Obesity;Pain;Increased edema OT Treatment Interventions: Self-care/ADL training;Energy conservation;DME and/or AE instruction;Therapeutic activities;Patient/family education   OT Goals(Current goals can be found in the care plan section) Acute Rehab OT Goals Patient Stated Goal: to go home OT Goal Formulation: With patient Time For Goal Achievement: 09/17/12 Potential to Achieve Goals: Good  Visit Information  Last OT Received On: 09/03/12 Assistance Needed: +1 History of Present Illness: S/P CABG       Prior Functioning     Home Living Family/patient expects to be discharged to:: Private residence Living Arrangements: Spouse/significant other Available Help at Discharge: Friend(s) Type of Home: House Home Access: [place]: One [place]: Environmental consultant - 2 wheels;Bedside commode;Tub bench (wife is going to check to make sure tey have equipment) Prior Function Level of Independence: Independent Dominant Hand: Right         Vision/Perception     Cognition  Cognition Arousal/Alertness: Awake/alert Behavior During Therapy: WFL for tasks assessed/performed Overall Cognitive Status: Within Functional Limits for tasks assessed    Extremity/Trunk Assessment Upper Extremity Assessment Upper Extremity Assessment: Overall WFL for tasks assessed Lower Extremity Assessment Lower Extremity Assessment: Defer to PT evaluation     Mobility Bed Mobility Bed Mobility: Rolling Right;Right Sidelying to Sit Rolling Right: 4: Min assist Transfers Transfers: Sit to Stand;Stand to Sit Sit to Stand: 4: Min assist;Without upper extremity assist;From bed Stand to Sit: 4: Min guard;To chair/3-in-1 Details for Transfer Assistance:  vc for sternal precautions     Exercise     Balance Balance Balance Assessed:  (good)   End  of Session OT - End of Session Equipment Utilized During Treatment: Gait belt;Rolling walker Activity Tolerance: Patient tolerated treatment well Patient left: in chair;with call bell/phone within reach;with family/visitor present Nurse Communication: Mobility status;Other (comment) (D/C needs)  GO     Jossue Rubenstein,HILLARY 09/03/2012, 1:29 PM [facility], OTR/L  [phone] 09/03/2012

## 2012-09-03 NOTE — Progress Notes (Signed)
PT Cancellation Note  Patient Details Name: Max Anderson MRN: [id number] DOB: [birth year]   Cancelled Treatment:    Reason Eval/Treat Not Completed: Other (Prior to lunch pt refusing due to fatigue after amb to bathroom and now refusing any amb - see cardiac rehab note). Wife has been assisting pt with mobility and plans to take pt home.  Recommend HHPT at dc.   [name] 09/03/2012, 2:05 PM

## 2012-09-03 NOTE — Progress Notes (Signed)
Met with patient and his wife of 46 years at bedside. Discussed possible need for SNF at d/c but patient does not feel he will need this- "we have a ramp and everything we need at home". Wife states that the patient is not very mobile at home- "he's not the best walker". They feel that he can manage at home and decline SNF at this time. Wife can be with him at home and are open to [facility] with HH/DME needs. Will sign off and have made RNCM aware of above.  Eduard Clos, MSW [phone]

## 2012-09-03 NOTE — Progress Notes (Signed)
Pt in recliner upon arrival.  Encouraged pt several times to walk, but refused.  Tried reinforcing goals, but did not respond well and was irritated.  [id number] Archie Endo, MS, ACSM RCEP

## 2012-09-03 NOTE — Discharge Summary (Signed)
[address]       [place] [address]             [phone]       Jerard W Dady 11/19/1940 72 y.o. [id number]  08/25/2012   Melrose Nakayama, MD  Unstable angina [411.1]  HPI:at time of admission  72 yo male with history of CAD, HTN, DM, AAA, gout admitted on transfer from [facility] ED with c/o chest pain. He has a history of CAD with last cath in 2012 at which time he was found to have severe disease in the proximal LAD just before a large aneurysmal segment. Flow wire analysis of the proximal LAD at that time with FFR of 0.81 so medical management was recommended. There was moderate disease in the remainder of the LAD, the Circumflex and the RCA. He has been managed medically over the last 2 years. He presents today with c/o chest pressure since am today. Some resolution with IV NTG in ED but now 7/10 chest pain. No SOB.he required admission for further evaluation and treatment to include cardiac catheterization.  Past Medical History   Diagnosis  Date   .  CAD (coronary artery disease)      multiple stents to the circ and right coronary artery. 2007 [facility]   .  HTN (hypertension)    .  AAA (abdominal aortic aneurysm)    .  Gout    .  Diabetes mellitus    .  Prostate cancer    .  Urinary incontinence due to urethral sphincter incompetence     Past Surgical History   Procedure  Laterality  Date   .  Coronary stent placement       He had 2.5 x 20-mm Taxus stent placed in the his circ in August 2007 He had 2.5 x 20-mm Taxus stent placed in the mid circ and 2.5 x 16-mm stent placed in the distal circ.   [name]  Nose surgery     .  Prostate surgery   1998     has urinary prosthesis for incontinence   .  Appendectomy     .  Cholecystectomy     .  Ankle fracture surgery     .  Above -knee to below knee popliteal bypass   novemeber 2010   .  Knee surgery   10-2010   .  Back surgery      Family History   Problem  Relation  Age of Onset   .  Heart  attack  Mother     Social History: reports that he quit smoking about 25 years ago. He does not have any smokeless tobacco history on file. He reports that he does not drink alcohol or use illicit drugs.  Allergies:  Allergies   Allergen  Reactions   .  Niaspan (Niacin)    .  Statins      Myalgias   .  Tolectin (Tolmetin Sodium)     Medications:  Prior to Admission:  Prescriptions prior to admission   Medication  Sig  Dispense  Refill   .  allopurinol (ZYLOPRIM) 300 MG tablet  Take 300 mg by mouth daily.     [name]  amLODipine (NORVASC) 5 MG tablet  Take 5 mg by mouth daily.     [name]  aspirin 81 MG tablet  Take 81 mg by mouth daily.     [name]  atenolol (TENORMIN) 50 MG tablet  Take 50  mg by mouth daily.     [name]  buPROPion (WELLBUTRIN XL) 300 MG 24 hr tablet  Take 300 mg by mouth daily.     .  clonazePAM (KLONOPIN) 0.5 MG tablet  Take 0.5 mg by mouth 2 (two) times daily as needed for anxiety. Takes 1 tablet daily.     .  clopidogrel (PLAVIX) 75 MG tablet  Take 75 mg by mouth daily.     .  furosemide (LASIX) 20 MG tablet  Take 20 mg by mouth daily.     [name]  HYDROcodone-acetaminophen (NORCO) 10-325 MG per tablet  Take 1 tablet by mouth every 6 (six) hours as needed for pain.     [name]  insulin NPH-insulin regular (HUMULIN 70/30) (70-30) 100 UNIT/ML injection  Inject 110 Units into the skin 2 (two) times daily with a meal. As directed     .  isosorbide mononitrate (IMDUR) 60 MG 24 hr tablet  Take 60 mg by mouth daily.     [name]  losartan (COZAAR) 100 MG tablet  Take 100 mg by mouth daily.     .  nitroGLYCERIN (NITROSTAT) 0.4 MG SL tablet  Place 0.4 mg under the tongue every 5 (five) minutes as needed for chest pain.     [name]  PARoxetine (PAXIL) 40 MG tablet  Take 40 mg by mouth every morning.     .  potassium chloride (K-DUR) 10 MEQ tablet  Take 10 mEq by mouth 2 (two) times daily.         Hospital Course:  The patient was admitted to the stent down unit. He was placed on intravenous heparin as well as  nitroglycerin. Cardiac markers were cycled. He is scheduled for cardiac catheterization which was done on 08/25/2012 with the following results noted:   Cardiac Catheterization Procedure Note  Name: JYMERE SQUIERS  MRN: [id number]  DOB: 01-18-1941  Procedure: Left Heart Cath, Selective Coronary Angiography, LV angiography  Indication: unstable angina.  Medications:  Sedation: 1 mg IV Versed, 25 mcg IV Fentanyl  Contrast: 90 ml Omnipaque  Procedural Details: The right wrist was prepped, draped, and anesthetized with 1% lidocaine. Using the modified Seldinger technique, a 5 French sheath was introduced into the right radial artery. 3 mg of verapamil was administered through the sheath, weight-based unfractionated heparin was administered intravenously. A TIG catheter was used for selective coronary angiography. A pigtail catheter was used for left ventriculography. Catheter exchanges were performed over an exchange length guidewire. There were no immediate procedural complications. A TR band was used for radial hemostasis at the completion of the procedure. The patient was transferred to the post catheterization recovery area for further monitoring.  Procedural Findings:  Hemodynamics:  AO: 147/86 mmHg  LV: 146/18 mmHg  LVEDP: 32 mmHg  Coronary angiography:  Coronary dominance: Right  Left Main: Large in size with no significant disease.  Left Anterior Descending (LAD): Normal in size with an 80-90% proximal stenosis followed by a large aneurysmal segment which is followed by a 20% stenosis. In the midsegment, there is 40% tubular stenosis inside the previously placed stent. The distal LAD has minor irregularities.  1st diagonal (D1): Small in size with minor irregularities.  2nd diagonal (D2): Normal in size with 20% ostial stenosis.  3rd diagonal (D3): Normal in size with minor irregularities.  Circumflex (LCx): Normal in size and nondominant. The vessel is occluded in the midsegment at the  site of the previously placed stent.  1st obtuse marginal: Small in size with  no significant disease.  2nd obtuse marginal: Normal in size with 95% ostial stenosis. The distal vessel is underfilling but there might be diffuse 60% disease in the midsegment.  Right Coronary Artery: Large in size and dominant. A stent is noted proximally with mild 20% in-stent restenosis. There is diffuse 30% disease in the midsegment. There is 95% stenosis distally just between 2 aneurysmal segments.  Posterior descending artery: Large in size with minor irregularities.  Posterior AV segment: Normal in size with no significant disease.  Posterolateral branchs: Large in size with no significant disease. These gets faint collaterals to the distal left circumflex distribution. Left ventriculography: Left ventricular systolic function is normal , LVEF is estimated at 55 %, there is no significant mitral regurgitation  Final Conclusions:  1. Significant three-vessel coronary artery disease with large aneurysmal segment in the proximal LAD as well as aneurysmal segments in RCA.  2. Normal LV systolic function.  3. Moderately to severely elevated left ventricular end-diastolic pressure.  Recommendations:  cardiothoracic surgical consult for CABG is recommended. The patient has been on Plavix which will be stopped. I will start heparin 8 hours after sheath pull.  Kathlyn Sacramento MD, [facility]  08/25/2012, 5:27 PM   Dr. Modesto Charon was consulted. He evaluated the patient and his studies and agree with recommendations to proceed with coronary artery surgical revascularization.the patient has a known artificial GU sphincter which was deactivated prior to surgery by urology.he remained medically stable and on 08/27/2012 was taken to the operating room where he underwent the following procedure.  OPERATIVE REPORT  PREOPERATIVE DIAGNOSIS: Severe three-vessel coronary disease, status  post myocardial infarction.  POSTOPERATIVE  DIAGNOSIS: Severe three-vessel coronary disease, status  post myocardial infarction.  PROCEDURE: Median sternotomy, extracorporeal circulation, coronary  artery bypass grafting x4 (left internal mammary artery to left anterior  descending artery, saphenous vein graft to posterior descending,  sequential saphenous vein graft to obtuse marginals 1 and 2), endoscopic  vein harvest of left leg, placement of right femoral arterial monitoring  line.  SURGEON: Revonda Standard. Roxan Hockey, M.D.  ASSISTANTEllwood Handler, PA.  ANESTHESIA: General.  FINDINGS: Procedure was difficult due to the patient's body habitus and  morbid obesity. Left mammary large caliber, good flow, vein fair  quality. OM 1, small and fair quality, target difficult to visualize.  Remaining targets, good quality. Transesophageal echocardiography  revealed some lateral wall hypokinesis and 1 to 2+ mitral regurgitation  pre and post bypass.  Postoperative hospital course: The patient was extubated on postoperative day #1. He has remained neurologically intact. He remained hemodynamically stable and Swan-Ganz catheter was removed after extubation. Dopamine was initially continued at low dose due to a slight increase in postop creatinine. He did develop postoperative atrial fibrillation. He did convert to sinus rhythm with amiodarone but continued to have some intermittent paroxysms of atrial fibrillation and was also placed on digoxin and beta blocker. He did have moderate postoperative volume overload and require diuresis. He continued to have urinary leakage and was evaluated postoperatively by urologist. He is felt to have a device failure and will require further management as an outpatient. He required aggressive pulmonary toilet and oxygen was eventually able to be weaned off. He tolerating gradually increasing activities using standard cardiac rehabilitation protocols as well as physical therapy. Blood sugars have been under adequate  control using standard protocols and he will probably require adjustment as an outpatient back to his preoperative insulin dosing. He did have an acute blood loss anemia but values have  stabilized.    Recent Labs  09/06/12 0545  WBC 9.2  HGB 11.0*  HCT 33.7*  PLT 390   No results found for this basename: INR,  in the last 72 hours   Discharge Instructions:  The patient is discharged to home with extensive instructions on wound care and progressive ambulation.  They are instructed not to drive or perform any heavy lifting until returning to see the physician in his office.  Discharge Diagnosis:  Unstable angina [411.1] Postoperative atrial fibrillation Urethral sphincter device failure Expected acute blood loss anemia Secondary Diagnosis: Patient Active Problem List   Diagnosis Date Noted  . SEMI (subendocardial myocardial infarction) 08/26/2012  . Urinary incontinence due to urethral sphincter incompetence   . Abdominal aneurysm without mention of rupture 07/22/2011  . Peripheral vascular disease, unspecified 07/22/2011  . Edema 06/11/2010  . PVD (peripheral vascular disease) 06/11/2010  . DIABETES MELLITUS, TYPE II 06/30/2008  . HYPERTENSION, UNSPECIFIED 06/30/2008  . CAD 06/30/2008  . GOUT, HX OF 06/30/2008  . ABDOMINAL AORTIC ANEURYSM, HX OF 06/30/2008   Past Medical History  Diagnosis Date  . CAD (coronary artery disease)     multiple stents to the circ and right coronary artery. 2007 [facility]  . HTN (hypertension)   . AAA (abdominal aortic aneurysm)   . Gout   . Diabetes mellitus   . Prostate cancer   . Urinary incontinence due to urethral sphincter incompetence    Follow-up Information   Follow up with HENDRICKSON,STEVEN C, MD. (PA/LAT CXR to be taken (at [facility] which is in the same building as Dr. Leonarda Salon office) on 09/21/2012 at 10:00 am;Appointment with Dr. Roxan Hockey is on 09/21/2012 at 11:00 am)    Contact information:   [address] [place] [address] [phone]       Follow up with Kathlyn Sacramento, MD On 09/16/2012. (Appointment time is at 9:15 am)    Contact information:   [place] [address] [phone]       Follow up with Asencion Noble, MD. (Call for further diabetes management and follow up HGA1C 7.7)    Contact information:   [place] [address] [place] [address] [phone]       Follow up with Malka So, MD. (Call for a follow up appointment)    Contact information:   [place] [address] [phone]        Medication List    STOP taking these medications       aspirin 81 MG tablet  Replaced by:  aspirin 325 MG EC tablet     clopidogrel 75 MG tablet  Commonly known as:  PLAVIX     HYDROcodone-acetaminophen 10-325 MG per tablet  Commonly known as:  NORCO     isosorbide mononitrate 60 MG 24 hr tablet  Commonly known as:  IMDUR     losartan 100 MG tablet  Commonly known as:  COZAAR     nitroGLYCERIN 0.4 MG SL tablet  Commonly known as:  NITROSTAT      TAKE these medications       albuterol 108 (90 BASE) MCG/ACT inhaler  Commonly known as:  PROVENTIL HFA;VENTOLIN HFA  Inhale 2 puffs into the lungs every 6 (six) hours as needed for wheezing.     allopurinol 300 MG tablet  Commonly known as:  ZYLOPRIM  Take 300 mg by mouth daily.     amiodarone  200 MG tablet  Commonly known as:  PACERONE  Take 1 tablet (200 mg total) by mouth 2 (two) times daily.     amLODipine 5 MG tablet  Commonly known as:  NORVASC  Take 5 mg by mouth daily.     aspirin 325 MG EC tablet  Take 1 tablet (325 mg total) by mouth daily.     atenolol 50 MG tablet  Commonly known as:  TENORMIN  Take 50 mg by mouth daily.     buPROPion 300 MG 24 hr tablet  Commonly known as:  WELLBUTRIN XL  Take 300 mg by mouth daily.     clonazePAM 0.5 MG tablet  Commonly known as:  KLONOPIN  Take  0.5 mg by mouth 2 (two) times daily as needed for anxiety. Takes 1 tablet daily.     digoxin 0.125 MG tablet  Commonly known as:  LANOXIN  Take 1 tablet (0.125 mg total) by mouth daily.     furosemide 40 MG tablet  Commonly known as:  LASIX  Take 1 tablet (40 mg total) by mouth daily.     insulin NPH-regular (70-30) 100 UNIT/ML injection  Commonly known as:  HUMULIN 70/30  Inject 36 Units into the skin 2 (two) times daily with a meal. And as glucose levels increase, increase insulin as was on 110 units injected twice daily before surgery.     PARoxetine 40 MG tablet  Commonly known as:  PAXIL  Take 40 mg by mouth every morning.     potassium chloride 10 MEQ tablet  Commonly known as:  K-DUR  Take 1 tablet (10 mEq total) by mouth 2 (two) times daily.     traMADol 50 MG tablet  Commonly known as:  ULTRAM  Take 1-2 tablets (50-100 mg total) by mouth every 4 (four) hours as needed for pain.          1.Beta Blocker:  Yes Blue.Reese   ]                              No   [   ]                              If No, reason:  2.Ace Inhibitor/ARB: Yes [   ]                                     No  [ n   ]                                     If No, reason:mild postoperative renal insufficiency, may be able to start as an outpatient  3.Statin:   Yes [   ]                  No  [n   ]                  If No, reason:allergy  4.Shela CommonsVelta Addison  [ y  ]                  No   [   ]  If No, reason:   Disposition: Discharged home  Patient's condition is Good  Jadene Pierini, PA-C 09/08/2012  8:32 AM

## 2012-09-03 NOTE — Progress Notes (Signed)
Patient ID: Max Anderson, male   DOB: September 17, 1940, 72 y.o.   MRN: [id number] I was called today to see Max Anderson.  He is still incontinent of urine.  I attempted to reactivate the sphincter by squeezing the side opposite to the deactivation button.  The lower part of the control pump never filled  with fluid.  I spent about thirty minutes trying to reactivate the sphincter but was unsuccessful.  The patient continued to leak urine.  Pateint can be discharge when he is ready with external catheter.  Will follow as outpatient and ask Dr Matilde Sprang to see him in consultation.

## 2012-09-03 NOTE — Progress Notes (Addendum)
      [address]       [place] [address]             [phone]        7 Days Post-Op Procedure(s) (LRB): CORONARY ARTERY BYPASS GRAFTING times four using Left Greater Saphenous Vein Graft harvested endoscopically and Left Internal Mammary Artery (N/A) TRANSESOPHAGEAL ECHOCARDIOGRAM (TEE) (N/A)  Subjective: Patient with complaints his ureteral sphincter device is not working properly.  Objective: Vital signs in last 24 hours: Temp:  [97.8 F (36.6 C)-98.6 F (37 C)] 97.8 F (36.6 C) (07/25 0634) Pulse Rate:  [42-110] 61 (07/25 0634) Cardiac Rhythm:  [-] Normal sinus rhythm;Heart block (07/24 2046) Resp:  [15-19] 18 (07/25 0634) BP: (104-119)/(53-75) 119/65 mmHg (07/25 0634) SpO2:  [91 %-98 %] 97 % (07/25 0634) Weight:  [131.09 kg (289 lb)] 131.09 kg (289 lb) (07/25 0634)  Pre op weight  127 kg Current Weight  09/03/12 131.09 kg (289 lb)      Intake/Output from previous day: 07/24 0701 - 07/25 0700 In: -  Out: 800 [Urine:800]   Physical Exam:  Cardiovascular: RRR, no murmurs, gallops, or rubs. Pulmonary: Diminished at bases; no rales, wheezes, or rhonchi. Abdomen: Soft, non tender, bowel sounds present. Extremities: Mild bilateral lower extremity edema. Wounds:Dried bloody sternal drainage on sternal dressing  Lab Results: CBC: Recent Labs  09/02/12 0350 09/03/12 0423  WBC 7.2 8.1  HGB 10.4* 10.0*  HCT 31.4* 31.2*  PLT 236 269   BMET:  Recent Labs  09/02/12 0350 09/03/12 0423  NA 136 137  K 3.5 4.0  CL 99 100  CO2 29 28  GLUCOSE 171* 149*  BUN 36* 35*  CREATININE 1.20 1.18  CALCIUM 8.6 8.5    PT/INR:  Lab Results  Component Value Date   INR 1.34 08/27/2012   INR 1.03 08/25/2012   INR 1.0 06/11/2010   ABG:  INR: Will add last result for INR, ABG once components are confirmed Will add last 4 CBG results once components are confirmed  Assessment/Plan:  1. CV - Previous a fib with RVR. Maintaining SR. On Amiodarone  400 bid, Atenolol 50 daily, Digoxin 0.25 IV daily. Will change Digoxin to PO. 2.  Pulmonary - Encourage incentive spirometer 3. Volume Overload - On Lasix 80 daily 4.  Acute blood loss anemia - H and H stable at 10 and 31.2 5.DM- CBGs 136/100/143. Pre op HGA1C 7.7. Continue Insulin as ordered. 6.Patient with condom cath this am as urine was leaking. Urologist did reactivate the sphincter yesterday, but continues to leak. Will need urologist to evaluate. 7.Possible discharge this weekend  ZIMMERMAN,DONIELLE MPA-C 09/03/2012,8:09 AM  Patient seen and examined, agree with above Sternal incision drainage decreasing, no sign of infection Will need Urology to check urinary sphincter prosthesis again

## 2012-09-04 LAB — GLUCOSE, CAPILLARY
Glucose-Capillary: 147 mg/dL — ABNORMAL HIGH (ref 70–99)
Glucose-Capillary: 160 mg/dL — ABNORMAL HIGH (ref 70–99)
Glucose-Capillary: 134 mg/dL — ABNORMAL HIGH (ref 70–99)

## 2012-09-04 MED ORDER — AMLODIPINE BESYLATE 5 MG PO TABS
5.0000 mg | ORAL_TABLET | Freq: Every day | ORAL | Status: DC
  Administered 2012-09-04 – 2012-09-08 (×5): 5 mg via ORAL
  Filled 2012-09-04 (×5): qty 1

## 2012-09-04 MED ORDER — GUAIFENESIN ER 600 MG PO TB12
600.0000 mg | ORAL_TABLET | Freq: Two times a day (BID) | ORAL | Status: DC
  Administered 2012-09-04 – 2012-09-08 (×9): 600 mg via ORAL
  Filled 2012-09-04 (×10): qty 1

## 2012-09-04 MED ORDER — INSULIN DETEMIR 100 UNIT/ML ~~LOC~~ SOLN
28.0000 [IU] | Freq: Two times a day (BID) | SUBCUTANEOUS | Status: DC
  Administered 2012-09-04 – 2012-09-08 (×9): 28 [IU] via SUBCUTANEOUS
  Filled 2012-09-04 (×10): qty 0.28

## 2012-09-04 NOTE — Progress Notes (Addendum)
       [address]       [place] [address]             [phone]          8 Days Post-Op Procedure(s) (LRB): CORONARY ARTERY BYPASS GRAFTING times four using Left Greater Saphenous Vein Graft harvested endoscopically and Left Internal Mammary Artery (N/A) TRANSESOPHAGEAL ECHOCARDIOGRAM (TEE) (N/A)  Subjective: Quite sore in chest from coughing spell.  +clear sputum.  Has not been walking much.   Objective: Vital signs in last 24 hours: Patient Vitals for the past 24 hrs:  BP Temp Temp src Pulse Resp SpO2 Weight  09/04/12 0520 135/72 mmHg 98.6 F (37 C) Oral - 18 - -  09/04/12 0500 - - - - - - 282 lb 10.1 oz (128.2 kg)  09/03/12 2012 140/113 mmHg 98.7 F (37.1 C) Oral 62 18 98 % -  09/03/12 1306 125/61 mmHg 97.6 F (36.4 C) Oral 65 16 97 % -   Current Weight  09/04/12 282 lb 10.1 oz (128.2 kg)     Intake/Output from previous day: 07/25 0701 - 07/26 0700 In: -  Out: 1300 [Urine:1300]  CBGs 164-113-147-145   PHYSICAL EXAM:  Heart: RRR Lungs: Few coarse BS bilaterally Wound: Sternal wound not draining at present, no sternal instability or click Extremities: Mild LE edema    Lab Results: CBC: Recent Labs  09/02/12 0350 09/03/12 0423  WBC 7.2 8.1  HGB 10.4* 10.0*  HCT 31.4* 31.2*  PLT 236 269   BMET:  Recent Labs  09/02/12 0350 09/03/12 0423  NA 136 137  K 3.5 4.0  CL 99 100  CO2 29 28  GLUCOSE 171* 149*  BUN 36* 35*  CREATININE 1.20 1.18  CALCIUM 8.6 8.5    PT/INR: No results found for this basename: LABPROT, INR,  in the last 72 hours    Assessment/Plan: S/P Procedure(s) (LRB): CORONARY ARTERY BYPASS GRAFTING times four using Left Greater Saphenous Vein Graft harvested endoscopically and Left Internal Mammary Artery (N/A) TRANSESOPHAGEAL ECHOCARDIOGRAM (TEE) (N/A)  CV- AF, now SR.  Continue Amio, Atenolol, Dig. BPs trending up- will resume low dose Norvasc and monitor.  DM- sugars fairly stable on Levemir,  Novolog.  A1C= 7.7  Was on 70/30 pre-op.  ?home on Levemir or resume home meds at discharge.  Vol overload- diurese.  GU- Dr. Janice Norrie saw the patient and attempted to adjust his urethral sphincter without success.  He recommends d/c home with condom cath and outpatient urology followup.  Pulm- still requiring 3L O2.  Work on increased pulm toilet and wean as able. Will add Mucinex for sputum.  Sternal drainage resolving, sternum stable.  Continue to watch.   LOS: 10 days    COLLINS,GINA H 09/04/2012  patient examined and medical record reviewed,agree with above note. Sinus rhythm Sternal incision looks much better Still requiring oxygen but starting angulated more Would continue home insulin regimen at discharge VAN TRIGT III,PETER 09/04/2012

## 2012-09-04 NOTE — Progress Notes (Signed)
CARDIAC REHAB PHASE I   PRE:  Rate/Rhythm: 75 sinus  BP:  Supine:   Sitting: 128/83  Standing:    SaO2: 955 3L  MODE:  Ambulation: 80 ft   POST:  Rate/Rhythem: 75 sinus  BP:  Supine:   Sitting: 127/51  Standing:    SaO2: 98% 3L  [phone] Pt ambulated in  Hallway x 2 assist pushing wheelchair.  Pt slow somewhat unsteady. Pt took 2 standing rest breaks. Pt fatigued easily.  Pt denied pain or dyspnea.  Pt education completed.  Pt oriented to CRPII.  At pt request, referral will be sent to [facility] outpatient cardiac rehab. Pt and wife given instruction to watch cardiac surgery video.  Written and verbal instruction given.  Understanding verbalized  Carolyne Littles

## 2012-09-04 NOTE — Progress Notes (Signed)
8 Days Post-Op  Subjective:  1 - Malfunctioning Artifical Urinary Sphincter - s/p artificial urinary sphincter that was inserted in 1998 in [place] with mild urinary incontinence at baseline post sphincter. Had it placed originally for severe post-prostatectomy leakage. Had sphincter de-activated prior to CABG 08/26/12 and temporary foley peri-op, now removed. Sphincter noted post-removal to not cycle.   Today Max Anderson is seen for re-attempt sphincter activation. He tried condom cath, but did not find it useful, preferring adult diaper instead. Overall bother minimal.   Objective: Vital signs in last 24 hours: Temp:  [98.6 F (37 C)-98.7 F (37.1 C)] 98.6 F (37 C) (07/26 0520) Pulse Rate:  [62-65] 65 (07/26 1358) Resp:  [18] 18 (07/26 0520) BP: (113-140)/(59-113) 118/68 mmHg (07/26 1358) SpO2:  [98 %] 98 % (07/25 2012) Weight:  [128.2 kg (282 lb 10.1 oz)] 128.2 kg (282 lb 10.1 oz) (07/26 0500) Last BM Date: 09/01/12  Intake/Output from previous day: 07/25 0701 - 07/26 0700 In: -  Out: 1300 [Urine:1300] Intake/Output this shift:    General appearance: alert, cooperative and appears stated age Head: Normocephalic, without obvious abnormality, atraumatic Eyes: conjunctivae/corneas clear. PERRL, EOM's intact. Fundi benign. Ears: normal TM's and external ear canals both ears Nose: Nares normal. Septum midline. Mucosa normal. No drainage or sinus tenderness. Throat: lips, mucosa, and tongue normal; teeth and gums normal Male genitalia: normal, sphincter attempted to cycle x several and unsuccesful. Incontinenne into adult diaper without skin breakdown.  Extremities: extremities normal, atraumatic, no cyanosis or edema Pulses: 2+ and symmetric Skin: Skin color, texture, turgor normal. No rashes or lesions Neurologic: Grossly normal  Lab Results:   Recent Labs  09/02/12 0350 09/03/12 0423  WBC 7.2 8.1  HGB 10.4* 10.0*  HCT 31.4* 31.2*  PLT 236 269   BMET  Recent Labs  09/02/12 0350 09/03/12 0423  NA 136 137  K 3.5 4.0  CL 99 100  CO2 29 28  GLUCOSE 171* 149*  BUN 36* 35*  CREATININE 1.20 1.18  CALCIUM 8.6 8.5   PT/INR No results found for this basename: LABPROT, INR,  in the last 72 hours ABG No results found for this basename: PHART, PCO2, PO2, HCO3,  in the last 72 hours  Studies/Results: No results found.  Anti-infectives: Anti-infectives   Start     Dose/Rate Route Frequency Ordered Stop   08/27/12 2015  vancomycin (VANCOCIN) IVPB 1000 mg/200 mL premix     1,000 mg 200 mL/hr over 60 Minutes Intravenous  Once 08/27/12 1433 08/27/12 2048   08/27/12 1615  cefUROXime (ZINACEF) 1.5 g in dextrose 5 % 50 mL IVPB     1.5 g 100 mL/hr over 30 Minutes Intravenous Every 12 hours 08/27/12 1433 08/29/12 0515   08/27/12 0400  vancomycin (VANCOCIN) 1,250 mg in sodium chloride 0.9 % 250 mL IVPB  Status:  Discontinued     1,250 mg 166.7 mL/hr over 90 Minutes Intravenous To Surgery 08/26/12 1329 08/26/12 1437   08/27/12 0400  cefUROXime (ZINACEF) 1.5 g in dextrose 5 % 50 mL IVPB     1.5 g 100 mL/hr over 30 Minutes Intravenous To Surgery 08/26/12 1329 08/27/12 1231   08/27/12 0400  cefUROXime (ZINACEF) 750 mg in dextrose 5 % 50 mL IVPB  Status:  Discontinued     750 mg 100 mL/hr over 30 Minutes Intravenous To Surgery 08/26/12 1329 08/27/12 1357   08/27/12 0400  vancomycin (VANCOCIN) 1,500 mg in sodium chloride 0.9 % 250 mL IVPB     1,500 mg  125 mL/hr over 120 Minutes Intravenous To Surgery 08/26/12 1437 08/27/12 0800      Assessment/Plan:  1 - Malfunctioning Artifical Urinary Sphincter - Likely device failure. I again explained to pt that AUS typically have a 10-15 year life cycle after which device failure is common requiring revision. He has good understanding of this and realistic expectations.  We discussed further options for managing his stress leakage in interval including condom cath and adult diaper. He prefers adult diaper.  He will need  elective outpatient f/u with [facility] after discharge.  2 - Call with questions.  [facility], Max Anderson 09/04/2012

## 2012-09-04 NOTE — Progress Notes (Signed)
EPW d/c per MD order at 1330. All wires intact upon removal. All vital signs stable. Frequent vitals set up. Bed rest until 1430. Call bell in reach. Marko Plume

## 2012-09-05 ENCOUNTER — Inpatient Hospital Stay (HOSPITAL_COMMUNITY): Payer: Medicare Other

## 2012-09-05 LAB — GLUCOSE, CAPILLARY
Glucose-Capillary: 167 mg/dL — ABNORMAL HIGH (ref 70–99)
Glucose-Capillary: 141 mg/dL — ABNORMAL HIGH (ref 70–99)

## 2012-09-05 LAB — BASIC METABOLIC PANEL
Calcium: 8.8 mg/dL (ref 8.4–10.5)
Creatinine, Ser: 1.17 mg/dL (ref 0.50–1.35)
GFR calc Af Amer: 71 mL/min — ABNORMAL LOW (ref >90)

## 2012-09-05 MED ORDER — CEPHALEXIN 500 MG PO CAPS
500.0000 mg | ORAL_CAPSULE | Freq: Three times a day (TID) | ORAL | Status: DC
  Administered 2012-09-05 – 2012-09-08 (×9): 500 mg via ORAL
  Filled 2012-09-05 (×12): qty 1

## 2012-09-05 NOTE — Progress Notes (Addendum)
       [address]       [place] [address]             [phone]          9 Days Post-Op Procedure(s) (LRB): CORONARY ARTERY BYPASS GRAFTING times four using Left Greater Saphenous Vein Graft harvested endoscopically and Left Internal Mammary Artery (N/A) TRANSESOPHAGEAL ECHOCARDIOGRAM (TEE) (N/A)  Subjective: Still coughing, but not as bad as yesterday.  No other complaints.   Objective: Vital signs in last 24 hours: Patient Vitals for the past 24 hrs:  BP Temp Temp src Pulse Resp SpO2 Weight  09/05/12 0453 115/62 mmHg 97.6 F (36.4 C) Axillary 63 20 94 % -  09/05/12 0237 - - - - - - 284 lb 12.8 oz (129.184 kg)  09/04/12 2106 120/86 mmHg 98.1 F (36.7 C) Oral 89 18 96 % -  09/04/12 1358 118/68 mmHg - - 65 - - -  09/04/12 1327 113/65 mmHg - - 62 - - -  09/04/12 1323 118/59 mmHg - - 63 - - -   Current Weight  09/05/12 284 lb 12.8 oz (129.184 kg)     Intake/Output from previous day: 07/26 0701 - 07/27 0700 In: 120 [P.O.:120] Out: 600 [Urine:600]  CBGs 122-133-119    PHYSICAL EXAM:  Heart: RRR Lungs: Few crackles in bases, no rhonchi Wound: Clean and dry Extremities: Mild LE edema    Lab Results: CBC: Recent Labs  09/03/12 0423  WBC 8.1  HGB 10.0*  HCT 31.2*  PLT 269   BMET:  Recent Labs  09/03/12 0423 09/05/12 0532  NA 137 140  K 4.0 3.9  CL 100 101  CO2 28 30  GLUCOSE 149* 133*  BUN 35* 28*  CREATININE 1.18 1.17  CALCIUM 8.5 8.8    PT/INR: No results found for this basename: LABPROT, INR,  in the last 72 hours    Assessment/Plan: S/P Procedure(s) (LRB): CORONARY ARTERY BYPASS GRAFTING times four using Left Greater Saphenous Vein Graft harvested endoscopically and Left Internal Mammary Artery (N/A) TRANSESOPHAGEAL ECHOCARDIOGRAM (TEE) (N/A) CV- AF, maintaining SR. Continue Amio, Atenolol, Dig. BPs improved on home Norvasc.  DM- sugars stable on Levemir, Novolog. A1C= 7.7 Plan home on home insulin regimen. Vol  overload- diurese.  GU- Continue condowm cath/diaper for leakage.  Outpatient follow up with urology. Pulm- still requiring 3L O2. May need home O2 if unable to wean. No further sternal drainage. Hopefully home in next few days.   LOS: 11 days    COLLINS,GINA H 09/05/2012  patient examined and medical record reviewed,agree with above note. VAN TRIGT III,Timoteo Carreiro 09/05/2012

## 2012-09-05 NOTE — Progress Notes (Signed)
Chest tube sutures d/c per MD order. Steri strips applied. MSI painted with betadine. Scant amount of drainage present. MD aware. Max Anderson

## 2012-09-05 NOTE — Progress Notes (Signed)
RN and NT attempted to ambulate patient in hallway. Pt was in chair and made it to the door and back to the bed. Pt was complaining of being very short of breath. Pt was on 4L 02. Encouraged to continue to use IS and flutter valve and the importance of sternal precautions. Will attempt to ambulate again. Marko Plume

## 2012-09-06 LAB — BASIC METABOLIC PANEL
Chloride: 103 mEq/L (ref 96–112)
Creatinine, Ser: 1.15 mg/dL (ref 0.50–1.35)
GFR calc Af Amer: 72 mL/min — ABNORMAL LOW (ref >90)
Potassium: 4.4 mEq/L (ref 3.5–5.1)

## 2012-09-06 LAB — CBC
HCT: 33.7 % — ABNORMAL LOW (ref 39.0–52.0)
Hemoglobin: 11 g/dL — ABNORMAL LOW (ref 13.0–17.0)
RDW: 15.9 % — ABNORMAL HIGH (ref 11.5–15.5)
WBC: 9.2 10*3/uL (ref 4.0–10.5)

## 2012-09-06 LAB — GLUCOSE, CAPILLARY
Glucose-Capillary: 103 mg/dL — ABNORMAL HIGH (ref 70–99)
Glucose-Capillary: 121 mg/dL — ABNORMAL HIGH (ref 70–99)

## 2012-09-06 NOTE — Progress Notes (Addendum)
      [address]       ,[place] [address]             [phone]        10 Days Post-Op Procedure(s) (LRB): CORONARY ARTERY BYPASS GRAFTING times four using Left Greater Saphenous Vein Graft harvested endoscopically and Left Internal Mammary Artery (N/A) TRANSESOPHAGEAL ECHOCARDIOGRAM (TEE) (N/A)  Subjective: Patient with complaints of leaking urine. It is very bothersome to him. He would like urology to "fix it while here is here now".  Objective: Vital signs in last 24 hours: Temp:  [97.3 F (36.3 C)-98.2 F (36.8 C)] 97.3 F (36.3 C) (07/28 0409) Pulse Rate:  [60-61] 61 (07/28 0409) Cardiac Rhythm:  [-] Sinus bradycardia (07/27 1950) Resp:  [19-20] 20 (07/28 0409) BP: (120-145)/(53-61) 145/57 mmHg (07/28 0409) SpO2:  [93 %-97 %] 93 % (07/28 0409) Weight:  [127.1 kg (280 lb 3.3 oz)] 127.1 kg (280 lb 3.3 oz) (07/28 0409)  Pre op weight  127 kg Current Weight  09/06/12 127.1 kg (280 lb 3.3 oz)      Intake/Output from previous day: 07/27 0701 - 07/28 0700 In: 123 [P.O.:120; I.V.:3] Out: -    Physical Exam:  Cardiovascular: RRR, no murmurs, gallops, or rubs. Pulmonary: Diminished at bases; no rales, wheezes, or rhonchi. Abdomen: Soft, non tender, bowel sounds present. Extremities: Mild bilateral lower extremity edema. Wounds:Dried bloody sternal drainage on sternal dressing  Lab Results: CBC:  Recent Labs  09/06/12 0545  WBC 9.2  HGB 11.0*  HCT 33.7*  PLT 390   BMET:   Recent Labs  09/05/12 0532 09/06/12 0545  NA 140 138  K 3.9 4.4  CL 101 103  CO2 30 24  GLUCOSE 133* 113*  BUN 28* 27*  CREATININE 1.17 1.15  CALCIUM 8.8 8.8    PT/INR:  Lab Results  Component Value Date   INR 1.34 08/27/2012   INR 1.03 08/25/2012   INR 1.0 06/11/2010   ABG:  INR: Will add last result for INR, ABG once components are confirmed Will add last 4 CBG results once components are confirmed  Assessment/Plan:  1. CV - Previous a fib with  RVR. Maintaining SR. On Amiodarone 400 bid, Atenolol 50 daily, Digoxin 0.25  Daily, and Norvasc 5 daily. 2.  Pulmonary - On 3-4 liters of oxygen via [place]. Wean as tolerates.Encourage incentive spirometer 3. Volume Overload - On Lasix 80 daily 4.  Acute blood loss anemia - H and H stable at 11 and 33.7 5.DM- CBGs 167/141/103. Pre op HGA1C 7.7. Continue Insulin as ordered. 6.Patient with condom cath/diaper as likely a device failure. Patient to follow up with urologist as an outpatient. 7.Continue CRPI 8.Possible discharge 1-2 days   ZIMMERMAN,DONIELLE MPA-C 09/06/2012,7:26 AM

## 2012-09-06 NOTE — Progress Notes (Signed)
Physical Therapy Treatment Patient Details Name: Max Anderson MRN: [id number] DOB: Oct 15, 1940 Today's Date: 09/06/2012 Time: [id number] PT Time Calculation (min): 46 min  PT Assessment / Plan / Recommendation  History of Present Illness S/P CABG   Clinical Impression Pt demonstrates some modest improvements in activity and participation today. Patient still continues to struggle with sternal precaution compliance at this time. Educated patient on importance of following precautions and discussed with patient proper techniques to be used. Patient initially non-receptive to coaching and education on proper techniques. Will continue to monitor progress with precautions for safety concerns. Also educated patient on PLB techniques to ensure controlled breathing. Patient did demonstrate anxiousness during ambulation, O2 remained above 95% on 3 liters.  Will continue to see and progress activity as tolerated. Still feel it is in patients best interest for safety and mobility to dc to ST SNF as I do not believe wife will be able to provide the level of physical assist required for patient in compliance with sternal precautions.    PT Comments   Pt is insistent on returning home, does not appear willing to consider ST SNF, still this PTs recommendation to ensure safety as indicated. If refused, pt will need HHPT and increased assist for mobility and safety.  Will also need rollator walker.  Follow Up Recommendations  SNF (see PT comments above)     Does the patient have the potential to tolerate intense rehabilitation     Barriers to Discharge        Equipment Recommendations  Rollator Walker    Recommendations for Other Services    Frequency Min 3X/week   Progress towards PT Goals Progress towards PT goals: Progressing toward goals  Plan Current plan remains appropriate    Precautions / Restrictions Precautions Precautions: Sternal Restrictions Weight Bearing Restrictions: No   Pertinent  Vitals/Pain 4/10 pain at incision, SpO2 on 3 liters at rest 99%, during ambulation on 3 liters 95%    Mobility  Bed Mobility Bed Mobility: Rolling Right;Right Sidelying to Sit Rolling Right: 4: Min assist Supine to Sit: 3: Mod assist Details for Bed Mobility Assistance: VCs for sternal precautions and technique Transfers Transfers: Sit to Stand;Stand to Sit Sit to Stand: 4: Min assist;Without upper extremity assist;From bed Stand to Sit: 4: Min guard;To chair/3-in-1 Details for Transfer Assistance: VCs for sternal precautions and rocker technique Ambulation/Gait Ambulation/Gait Assistance: 4: Min guard Ambulation Distance (Feet): 70 Feet Assistive device: Rolling walker Ambulation/Gait Assistance Details: VCs for upright posture and encouragement, O2 sats monitored, patient anxious with ambulation, required 3 seated rest breaks, O2 >95% on 3 liters throughout Gait Pattern: Step-through pattern;Decreased stride length;Trunk flexed Gait velocity: decreased General Gait Details: limited by DOE      PT Goals (current goals can now be found in the care plan section) Acute Rehab PT Goals Patient Stated Goal: to go home PT Goal Formulation: With patient Time For Goal Achievement: 09/15/12 Potential to Achieve Goals: Good  Visit Information  Last PT Received On: 09/06/12 Assistance Needed: +1 History of Present Illness: S/P CABG    Subjective Data  Subjective: I just can't breathe Patient Stated Goal: to go home   Cognition  Cognition Arousal/Alertness: Awake/alert Behavior During Therapy: WFL for tasks assessed/performed Overall Cognitive Status: Within Functional Limits for tasks assessed    Balance  Balance Balance Assessed: Yes High Level Balance High Level Balance Activites: Side stepping;Backward walking;Direction changes;Turns High Level Balance Comments: some safety deficits upon fatigue   End of Session PT -  End of Session Equipment Utilized During Treatment:  Gait belt Activity Tolerance: Patient limited by fatigue Patient left: in chair;with call bell/phone within reach;with family/visitor present Nurse Communication: Mobility status   GP     Max Anderson 09/06/2012, 11:48 AM [name], PT DPT  [phone]

## 2012-09-07 LAB — GLUCOSE, CAPILLARY
Glucose-Capillary: 106 mg/dL — ABNORMAL HIGH (ref 70–99)
Glucose-Capillary: 185 mg/dL — ABNORMAL HIGH (ref 70–99)
Glucose-Capillary: 112 mg/dL — ABNORMAL HIGH (ref 70–99)

## 2012-09-07 MED ORDER — LEVALBUTEROL HCL 0.63 MG/3ML IN NEBU
0.6300 mg | INHALATION_SOLUTION | Freq: Four times a day (QID) | RESPIRATORY_TRACT | Status: DC
  Administered 2012-09-07 – 2012-09-08 (×5): 0.63 mg via RESPIRATORY_TRACT
  Filled 2012-09-07 (×9): qty 3

## 2012-09-07 NOTE — Progress Notes (Signed)
CARDIAC REHAB PHASE I   PRE:  Rate/Rhythm: 59 SB    BP: sitting 109/70    SaO2: 90-91 RA  MODE:  Ambulation: 220 ft   POST:  Rate/Rhythm: 62 SR    BP: sitting 110/72     SaO2: 94 RA  Pt in better spirits, cooperative and smiling.  Followed with chair due to sudden and severe anxiety/SOB. Had to sit x6 for this. Pt would hyperventilate but relax upon sitting and be able to walk again. Used RW and assist x1 (with assist x1 for w/c). No need for O2 noted. To bed after walk, wheezing in bed. SaO2 92-93 RA. HR with only minimal increase with activity. Will f/u as x2. [id number]  Josephina Shih [place] CES, ACSM 09/07/2012 3:00 PM

## 2012-09-07 NOTE — Progress Notes (Signed)
Physical Therapy Treatment Patient Details Name: Max Anderson MRN: [id number] DOB: Feb 06, 1941 Today's Date: 09/07/2012 Time: [id number] PT Time Calculation (min): 34 min  PT Assessment / Plan / Recommendation  History of Present Illness S/P CABG   Clinical Impression Patient demonstrates improvements and activity and compliance with sternal precautions today.  Patient activity tolerance limited by anxiety and DOE despite O2 levels remaining above 94% on room air throughout all activity. Feel patient will need supervision with intermittent assist upon discharge. Rec change to HHPT if assist can be provided by wife.      Follow Up Recommendations  Home health PT;Supervision/Assistance - 24 hour                 Frequency Min 3X/week   Progress towards PT Goals Progress towards PT goals: Progressing toward goals  Plan Discharge plan needs to be updated    Precautions / Restrictions Precautions Precautions: Sternal Restrictions Weight Bearing Restrictions: No   Pertinent Vitals/Pain HR 70s to 80s; spO2 on rm air >94% throughout session    Mobility  Bed Mobility Bed Mobility: Rolling Left;Left Sidelying to Sit Rolling Left: 4: Min assist Left Sidelying to Sit: 4: Min assist Details for Bed Mobility Assistance: Wife was able to assist with bed mobility. Demonstrates safety and better compliance with precautions today Transfers Transfers: Sit to Stand;Stand to Sit Sit to Stand: 5: Supervision Stand to Sit: 5: Supervision Details for Transfer Assistance: Minimal cues for hand position with sternal precautions today, improved over prior sessions Ambulation/Gait Ambulation/Gait Assistance: 4: Min guard Ambulation Distance (Feet): 180 Feet (broken down into varying increments, mult seated rest breaks) Assistive device: Rolling walker Ambulation/Gait Assistance Details: VCs for upright posture and proper use of assistive device. Patient demonstrates some safety concerns with  immediate needs to sit when feeling like he can't breath, O2 saturations remained aboved 94% on rm air. Patient appears very anxious with ambulation.  Cues given for patient to performed controlled and safe sitting when he feels he needs a rest. Gait Pattern: Step-through pattern;Decreased stride length;Trunk flexed Gait velocity: decreased General Gait Details: limited by DOE    Exercises Other Exercises Other Exercises: encouraged general UB AROM and LB AROM  Other Exercises: encouraged use of incentive spirometer     PT Goals (current goals can now be found in the care plan section) Acute Rehab PT Goals Patient Stated Goal: to go home PT Goal Formulation: With patient Time For Goal Achievement: 09/15/12 Potential to Achieve Goals: Good  Visit Information  Last PT Received On: 09/07/12 Assistance Needed: +1 History of Present Illness: S/P CABG    Subjective Data  Subjective: I have had 3 anxiety attacks Patient Stated Goal: to go home   Cognition  Cognition Arousal/Alertness: Awake/alert Behavior During Therapy: WFL for tasks assessed/performed Overall Cognitive Status: Within Functional Limits for tasks assessed    Balance  Balance Balance Assessed: Yes High Level Balance High Level Balance Activites: Side stepping;Backward walking;Direction changes;Turns High Level Balance Comments: VCs for proper use of rolling walker and some safety deficits upon fatigue   End of Session PT - End of Session Equipment Utilized During Treatment: Gait belt Activity Tolerance: Patient limited by fatigue Patient left: in chair;with call bell/phone within reach;with family/visitor present Nurse Communication: Mobility status   GP     [name] 09/07/2012, 9:54 AM [name], PT DPT  [phone]

## 2012-09-07 NOTE — Progress Notes (Signed)
Occupational Therapy Treatment Patient Details Name: CHRISANGEL FURFARO MRN: [id number] DOB: [birth year] Today's Date: 09/07/2012 Time: [id number] OT Time Calculation (min): 42 min  OT Assessment / Plan / Recommendation  History of present illness S/P CABG   Clinical Impression Pt making good progress today. Able to ambulate short distances @ RW level on  RA with O2 above 94. Dyspnea 3/4 at times. Wife able to demonstrate ability to assist with transfers and bed mobility. Pt apparently frustrated about not being able to go home, however, appears to understand need to improve breathing prior to D/C.    OT comments  Encouraged pt OOB and to walk with staff  Follow Up Recommendations  Home health OT;Other (comment)    Barriers to Discharge       Equipment Recommendations  3 in 1 bedside comode;Tub/shower bench;Other (comment)    Recommendations for Other Services    Frequency Min 2X/week   Progress towards OT Goals Progress towards OT goals: Progressing toward goals  Plan Discharge plan remains appropriate    Precautions / Restrictions Precautions Precautions: Sternal Restrictions Weight Bearing Restrictions: No   Pertinent Vitals/Pain O2 >94 RA with activity    ADL  Equipment Used: Gait belt;Rolling walker Transfers/Ambulation Related to ADLs: S. wife guiding trasnfers and bed mobility ADL Comments: focus of session on pt/family education, endurance/increasing activity tolerance, assessing O2.    OT Diagnosis:    OT Problem List:   OT Treatment Interventions:     OT Goals(current goals can now be found in the care plan section) Acute Rehab OT Goals Patient Stated Goal: to go home OT Goal Formulation: With patient Time For Goal Achievement: 09/17/12 Potential to Achieve Goals: Good ADL Goals Pt Will Transfer to Toilet: with supervision;bedside commode Pt Will Perform Toileting - Clothing Manipulation and hygiene: with supervision;with caregiver independent in  assisting;sit to/from stand Pt Will Perform Tub/Shower Transfer: with supervision;Tub transfer;ambulating;tub bench Additional ADL Goal #1: pt /wife will demonstrate carry over with sternal precautions with  min vc  Visit Information  Last OT Received On: 09/07/12 Assistance Needed: +1 PT/OT Co-Evaluation/Treatment: Yes History of Present Illness: S/P CABG    Subjective Data      Prior Functioning       Cognition  Cognition Arousal/Alertness: Awake/alert Behavior During Therapy: WFL for tasks assessed/performed Overall Cognitive Status: Within Functional Limits for tasks assessed    Mobility  Bed Mobility Bed Mobility: Rolling Left;Left Sidelying to Sit Details for Bed Mobility Assistance: all performed wit assist from wife appropriately Transfers Transfers: Sit to Stand;Stand to Sit Sit to Stand: 5: Supervision;From chair/3-in-1 Stand to Sit: 5: Supervision;To chair/3-in-1 Details for Transfer Assistance: wife assisted. vc for controlled descent and hand placement    Exercises  Other Exercises Other Exercises: encouraged general UB AROM and LB AROM  Other Exercises: encouraged use of incentive spirometer   Balance     End of Session OT - End of Session Equipment Utilized During Treatment: Gait belt;Rolling walker Activity Tolerance: Patient tolerated treatment well Patient left: in chair;with call bell/phone within reach;with family/visitor present Nurse Communication: Mobility status;Other (comment) (O2 sats)  GO     Milika Ventress,HILLARY 09/07/2012, 9:50 AM Maurie Boettcher, OTR/L  [phone] 09/07/2012

## 2012-09-07 NOTE — Progress Notes (Addendum)
      [address]       [place] [address]             [phone]      11 Days Post-Op Procedure(s) (LRB): CORONARY ARTERY BYPASS GRAFTING times four using Left Greater Saphenous Vein Graft harvested endoscopically and Left Internal Mammary Artery (N/A) TRANSESOPHAGEAL ECHOCARDIOGRAM (TEE) (N/A)  Subjective:  Max Anderson states he has to get out of here today.  He wants to be discharged.  He states he continues to have pain.  He also continues to express wishes to have his Urinary issues treated now why he is in the hospital.   Objective: Vital signs in last 24 hours: Temp:  [97.4 F (36.3 C)-97.8 F (36.6 C)] 97.8 F (36.6 C) (07/29 0432) Pulse Rate:  [54-63] 63 (07/29 0432) Cardiac Rhythm:  [-] Sinus bradycardia (07/28 2100) Resp:  [18-20] 18 (07/29 0432) BP: (116-142)/(61-76) 142/70 mmHg (07/29 0432) SpO2:  [92 %-96 %] 94 % (07/29 0432) Weight:  [273 lb 2.4 oz (123.9 kg)] 273 lb 2.4 oz (123.9 kg) (07/29 0500)  Intake/Output from previous day: 07/28 0701 - 07/29 0700 In: 660 [P.O.:660] Out: -   General appearance: alert, cooperative and no distress Heart: regular rate and rhythm Lungs: wheezes on right Abdomen: soft, non-tender; bowel sounds normal; no masses,  no organomegaly Extremities: edema 1+ Wound: clean and dry  Lab Results:  Recent Labs  09/06/12 0545  WBC 9.2  HGB 11.0*  HCT 33.7*  PLT 390   BMET:  Recent Labs  09/05/12 0532 09/06/12 0545  NA 140 138  K 3.9 4.4  CL 101 103  CO2 30 24  GLUCOSE 133* 113*  BUN 28* 27*  CREATININE 1.17 1.15  CALCIUM 8.8 8.8    PT/INR: No results found for this basename: LABPROT, INR,  in the last 72 hours ABG    Component Value Date/Time   PHART 7.532* 08/30/2012 0339   HCO3 21.3 08/30/2012 0339   TCO2 22 08/30/2012 0339   ACIDBASEDEF 1.0 08/30/2012 0339   O2SAT 98.0 08/30/2012 0339   CBG (last 3)   Recent Labs  09/06/12 1635 09/06/12 2057 09/07/12 0601  GLUCAP 138* 120* 81     Assessment/Plan: S/P Procedure(s) (LRB): CORONARY ARTERY BYPASS GRAFTING times four using Left Greater Saphenous Vein Graft harvested endoscopically and Left Internal Mammary Artery (N/A) TRANSESOPHAGEAL ECHOCARDIOGRAM (TEE) (N/A)  1. CV- previous Atrial Fibrillation, currently NSR- on Amiodarone, Atenolol, Digoxin, Norvasc 2. Pulm- has been weaned down to 2L with sats at 96%, new wheezing on exam, will change Xopenex to scheduled encouraged continued use of IS 3. Renal- creatinine mildly elevated, remains mildly volume overloaded, on Lasix 4. GU- probable failed artificial Urinary Sphincter- urology evaluated, will follow as outpatient 5. DM- CBGS controlled 6. Dispo- patient wants to be discharged today, Im concerned about Respiratory status and told patient I dont' think he is ready for discharge today, will discuss with staff, arrange home oxygen if necessary   LOS: 13 days    BARRETT, ERIN 09/07/2012  Still on o2, not ready for d/c I have seen and examined Max Anderson and agree with the above assessment  and plan.  Grace Isaac MD Beeper [phone] Office [phone] 09/07/2012 8:46 AM

## 2012-09-08 ENCOUNTER — Inpatient Hospital Stay (HOSPITAL_COMMUNITY): Payer: Medicare Other

## 2012-09-08 DIAGNOSIS — I2510 Atherosclerotic heart disease of native coronary artery without angina pectoris: Secondary | ICD-10-CM

## 2012-09-08 LAB — GLUCOSE, CAPILLARY: Glucose-Capillary: 93 mg/dL (ref 70–99)

## 2012-09-08 MED ORDER — DIGOXIN 125 MCG PO TABS: 0.1250 mg | ORAL_TABLET | Freq: Every day | ORAL | Status: DC

## 2012-09-08 MED ORDER — FUROSEMIDE 40 MG PO TABS: 40.0000 mg | ORAL_TABLET | Freq: Every day | ORAL | Status: DC

## 2012-09-08 MED ORDER — ASPIRIN 325 MG PO TBEC: 325.0000 mg | DELAYED_RELEASE_TABLET | Freq: Every day | ORAL | Status: AC

## 2012-09-08 MED ORDER — POTASSIUM CHLORIDE ER 10 MEQ PO TBCR: 10.0000 meq | EXTENDED_RELEASE_TABLET | Freq: Two times a day (BID) | ORAL | Status: AC

## 2012-09-08 MED ORDER — DIGOXIN 125 MCG PO TABS
0.1250 mg | ORAL_TABLET | Freq: Every day | ORAL | Status: DC
  Administered 2012-09-08: 0.125 mg via ORAL
  Filled 2012-09-08: qty 1

## 2012-09-08 MED ORDER — ALBUTEROL SULFATE HFA 108 (90 BASE) MCG/ACT IN AERS: 2.0000 | INHALATION_SPRAY | Freq: Four times a day (QID) | RESPIRATORY_TRACT | Status: DC | PRN

## 2012-09-08 MED ORDER — AMIODARONE HCL 200 MG PO TABS
200.0000 mg | ORAL_TABLET | Freq: Two times a day (BID) | ORAL | Status: DC
  Administered 2012-09-08: 200 mg via ORAL
  Filled 2012-09-08 (×2): qty 1

## 2012-09-08 MED ORDER — AMIODARONE HCL 200 MG PO TABS: 200.0000 mg | ORAL_TABLET | Freq: Two times a day (BID) | ORAL | Status: DC

## 2012-09-08 MED ORDER — TRAMADOL HCL 50 MG PO TABS: 50.0000 mg | ORAL_TABLET | ORAL | Status: DC | PRN

## 2012-09-08 MED ORDER — INSULIN NPH ISOPHANE & REGULAR (70-30) 100 UNIT/ML ~~LOC~~ SUSP: 36.0000 [IU] | Freq: Two times a day (BID) | SUBCUTANEOUS | Status: DC

## 2012-09-08 NOTE — Progress Notes (Signed)
Discharge instructions given and reviewed with patient and wife. Discharged home with wife.

## 2012-09-08 NOTE — Progress Notes (Addendum)
      [address]       [place] [address]             [phone]        12 Days Post-Op Procedure(s) (LRB): CORONARY ARTERY BYPASS GRAFTING times four using Left Greater Saphenous Vein Graft harvested endoscopically and Left Internal Mammary Artery (N/A) TRANSESOPHAGEAL ECHOCARDIOGRAM (TEE) (N/A)  Subjective: Patient had "severe anxiety" when walking with [facility] yesterday. He has a history of anxiety and Klonopin helps.  Objective: Vital signs in last 24 hours: Temp:  [97.1 F (36.2 C)-98.6 F (37 C)] 98.6 F (37 C) (07/30 0546) Pulse Rate:  [55-85] 61 (07/30 0546) Cardiac Rhythm:  [-] Sinus bradycardia (07/29 1929) Resp:  [18-19] 19 (07/30 0546) BP: (100-145)/(55-75) 116/75 mmHg (07/30 0546) SpO2:  [91 %-94 %] 93 % (07/30 0546) Weight:  [122.8 kg (270 lb 11.6 oz)] 122.8 kg (270 lb 11.6 oz) (07/30 0546)  Pre op weight  127 kg Current Weight  09/08/12 122.8 kg (270 lb 11.6 oz)      Intake/Output from previous day: 07/29 0701 - 07/30 0700 In: 120 [P.O.:120] Out: -    Physical Exam:  Cardiovascular: RRR, no murmurs, gallops, or rubs. Pulmonary: Slightly diminished at bases; expiratory wheezing. Abdomen: Soft, non tender, bowel sounds present. Extremities: Trace  bilateral lower extremity edema. Wounds:Clean and dry. No signs of infection.  Lab Results: CBC:  Recent Labs  09/06/12 0545  WBC 9.2  HGB 11.0*  HCT 33.7*  PLT 390   BMET:   Recent Labs  09/06/12 0545  NA 138  K 4.4  CL 103  CO2 24  GLUCOSE 113*  BUN 27*  CREATININE 1.15  CALCIUM 8.8    PT/INR:  Lab Results  Component Value Date   INR 1.34 08/27/2012   INR 1.03 08/25/2012   INR 1.0 06/11/2010   ABG:  INR: Will add last result for INR, ABG once components are confirmed Will add last 4 CBG results once components are confirmed  Assessment/Plan:  1. CV - Previous a fib with RVR. Maintaining SR. On Amiodarone 400 bid, Atenolol 50 daily, Digoxin 0.25  Daily, and  Norvasc 5 daily. Will decrease Amiodarone to 200 bid and Digoxin to 0.125 as HR in the 60's. Likely stop Digoxin as outpatient. 2.  Pulmonary - Was on 3-4 liters of oxygen via [place]. Weaned to room air. Encourage incentive spirometer. CXR appears stable. 3. Volume Overload - On Lasix 80 daily. He is below pre op weight. He was taking 20 mg daily prior to surgery. Will discuss with surgeon. 4.  Acute blood loss anemia - H and H stable at 11 and 33.7 5.DM- CBGs 185/112/93.Pre op HGA1C 7.7. Continue Insulin as ordered. 6.Patient with diaper as likely a device failure. Patient to follow up with urologist as an outpatient. 7.Discharge with HH today   Breland Elders MPA-C 09/08/2012,7:46 AM

## 2012-09-08 NOTE — Progress Notes (Signed)
Patient ID: Max Anderson, male   DOB: 10-08-1940, 72 y.o.   MRN: [id number]    Subjective:  Some dyspnea   Objective:  Filed Vitals:   09/07/12 2040 09/08/12 0212 09/08/12 0546 09/08/12 0806  BP:   116/75   Pulse:   61   Temp:   98.6 F (37 C)   TempSrc:   Oral   Resp:   19   Height:      Weight:   270 lb 11.6 oz (122.8 kg)   SpO2: 93% 94% 93% 93%    Intake/Output from previous day: No intake or output data in the 24 hours ending 09/08/12 [id number]  Physical Exam: Affect appropriate Obese white male  HEENT: normal Neck supple with no adenopathy JVP normal no bruits no thyromegaly Lungs clear with no wheezing and good diaphragmatic motion Heart:  S1/S2 no murmur, no rub, gallop or click post sternotomy PMI normal Abdomen: benighn, BS positve, no tenderness, no AAA no bruit.  No HSM or HJR Distal pulses intact with no bruits No edema Neuro non-focal Skin warm and dry No muscular weakness   Lab Results: Basic Metabolic Panel:  Recent Labs  09/06/12 0545  NA 138  K 4.4  CL 103  CO2 24  GLUCOSE 113*  BUN 27*  CREATININE 1.15  CALCIUM 8.8   Liver Function Tests: No results found for this basename: AST, ALT, ALKPHOS, BILITOT, PROT, ALBUMIN,  in the last 72 hours No results found for this basename: LIPASE, AMYLASE,  in the last 72 hours CBC:  Recent Labs  09/06/12 0545  WBC 9.2  HGB 11.0*  HCT 33.7*  MCV 93.9  PLT 390    Imaging: Dg Chest 2 View  09/08/2012   *RADIOLOGY REPORT*  Clinical Data: Shortness of breath and wheezing.  CHEST - 2 VIEW  Comparison: Chest x-ray 09/05/2012.  Findings: Persistent left basilar opacity may reflect areas of atelectasis and/or consolidation.  Small left pleural effusion. Calcified granulomas.  Pulmonary vasculature is within normal limits.  Heart size is upper limits of normal.  Mediastinal contours are unremarkable.  No pneumothorax.  Sternotomy wires.  IMPRESSION: 1.  Persistent left lower lobe opacity favored to  reflect postoperative subsegmental atelectasis, slightly improved. Decreasing small left pleural effusion and resolved right-sided pleural effusion.   Original Report Authenticated By: Vinnie Langton, M.D.    Cardiac Studies:  ECG:  SR nonspecific St/T wave changes    Telemetry: NSR no VT 09/08/2012   Echo:   Medications:   . allopurinol  300 mg Oral Daily  . amiodarone  200 mg Oral BID  . amLODipine  5 mg Oral Daily  . aspirin EC  325 mg Oral Daily  . atenolol  50 mg Oral Daily  . buPROPion  300 mg Oral Daily  . cephALEXin  500 mg Oral Q8H  . digoxin  0.125 mg Oral Daily  . docusate sodium  200 mg Oral Daily  . enoxaparin (LOVENOX) injection  40 mg Subcutaneous Q24H  . furosemide  80 mg Oral Daily  . guaiFENesin  600 mg Oral BID  . insulin aspart  0-20 Units Subcutaneous TID WC  . insulin aspart  0-5 Units Subcutaneous QHS  . insulin aspart  6 Units Subcutaneous TID WC  . insulin detemir  28 Units Subcutaneous BID  . levalbuterol  0.63 mg Nebulization Q6H  . pantoprazole  40 mg Oral QAC breakfast  . PARoxetine  40 mg Oral Q0600  . potassium chloride  20 mEq  Oral Daily  . sodium chloride  3 mL Intravenous Q12H       Assessment/Plan:  CAD:  Stable post CABG  Will need cardiac rehab Continue ASA and lasix Pulm:  Continue IS and mucinex  CXR with LLL subsegmental atelectasis PAF  Continue dig and amiodarone  Telemetry with NSR  Jenkins Rouge 09/08/2012, 9:29 AM

## 2012-09-14 ENCOUNTER — Telehealth: Payer: Self-pay | Admitting: Cardiovascular Disease

## 2012-09-14 NOTE — Telephone Encounter (Signed)
New problem   Max Anderson/[facility] would like for you to call patient concerning his medications he was sent home with to take after discharge. Please call pt.

## 2012-09-14 NOTE — Telephone Encounter (Signed)
I spoke with Butch Penny the [facility] and she questioned if the pt should be taking both Amiodarone and Digoxin. I made her aware that this is correct due to the pt having PAF after surgery.  She asked that I contact the pt's wife and make her aware of this information. I spoke with the pt's wife and answered her medication questions.  She also asked if a cough is normal after surgery (the pt is not taking an ACE). I made her aware that the pt does have evidence of a small amount of fluid in the the left lung base post surgery. This can contribute to cough. The pt is using incentive spirometer and splinting his chest when coughing.  The pt will keep appointment scheduled with Dr Fletcher Anon on 09/16/12. She also asked why the pt is not seeing Dr [name] back in follow-up.  It looks like [facility] arranged appointment with incorrect MD. Dr [name] is currently out of the office and I advised that she keep the appointment already arranged with Dr Fletcher Anon and then he can refer the pt back to Dr [name]. She agreed with plan.

## 2012-09-15 ENCOUNTER — Other Ambulatory Visit: Payer: Self-pay

## 2012-09-16 ENCOUNTER — Ambulatory Visit (INDEPENDENT_AMBULATORY_CARE_PROVIDER_SITE_OTHER): Payer: Medicare Other | Admitting: Cardiovascular Disease

## 2012-09-16 ENCOUNTER — Encounter: Payer: Self-pay | Admitting: Cardiovascular Disease

## 2012-09-16 VITALS — BP 104/64 | HR 55 | Ht 66.0 in | Wt 263.5 lb

## 2012-09-16 DIAGNOSIS — I519 Heart disease, unspecified: Secondary | ICD-10-CM

## 2012-09-16 DIAGNOSIS — R05 Cough: Secondary | ICD-10-CM

## 2012-09-16 DIAGNOSIS — R0602 Shortness of breath: Secondary | ICD-10-CM

## 2012-09-16 DIAGNOSIS — I2510 Atherosclerotic heart disease of native coronary artery without angina pectoris: Secondary | ICD-10-CM

## 2012-09-16 DIAGNOSIS — I4891 Unspecified atrial fibrillation: Secondary | ICD-10-CM

## 2012-09-16 DIAGNOSIS — I9789 Other postprocedural complications and disorders of the circulatory system, not elsewhere classified: Secondary | ICD-10-CM | POA: Insufficient documentation

## 2012-09-16 MED ORDER — MOMETASONE FUROATE 50 MCG/ACT NA SUSP: 2.0000 | Freq: Every day | NASAL | Status: DC

## 2012-09-16 MED ORDER — LEVOFLOXACIN 500 MG PO TABS: 500.0000 mg | ORAL_TABLET | Freq: Every day | ORAL | Status: DC

## 2012-09-16 NOTE — Progress Notes (Signed)
HPI  This is a 72 year old man who is here today for a followup visit after recent CABG. He is a patient of Dr. [name]. The followup appointment was scheduled from the hospital by mistake with me. He is known to have history of coronary artery disease with previous stenting. He presented recently with myocardial infarction. I performed cardiac catheterization which showed significant three-vessel coronary artery disease. The left circumflex was occluded at the previously placed stent. The initial impression was that this was probably chronic. However, he ruled out for myocardial infarction with significant elevation in troponin. He had significant aneurysmal disease in the left anterior descending artery and right coronary artery. CABG was felt to be the best option. Ejection fraction was normal. He underwent CABG by Dr. [name]. He had postoperative period of fibrillation treated with amiodarone, digoxin and atenolol. Since hospital discharge, he has been having significant cough especially at night which is preventing him from sleep. He had significant postnasal drip. The cough is productive of yellowish sputum. He feels fatigued but denies chest pain or significant dyspnea.  Allergies  Allergen Reactions  . Niaspan (Niacin)   . Statins     Myalgias  . Tolectin (Tolmetin Sodium)      Current Outpatient Prescriptions on File Prior to Visit  Medication Sig Dispense Refill  . albuterol (PROVENTIL HFA;VENTOLIN HFA) 108 (90 BASE) MCG/ACT inhaler Inhale 2 puffs into the lungs every 6 (six) hours as needed for wheezing.  1 Inhaler  2  . allopurinol (ZYLOPRIM) 300 MG tablet Take 300 mg by mouth daily.        [name] amiodarone (PACERONE) 200 MG tablet Take 1 tablet (200 mg total) by mouth 2 (two) times daily.  60 tablet  1  . amLODipine (NORVASC) 5 MG tablet Take 5 mg by mouth daily.      [name] aspirin EC 325 MG EC tablet Take 1 tablet (325 mg total) by mouth daily.  30 tablet  0  . atenolol (TENORMIN)  50 MG tablet Take 50 mg by mouth daily.      [name] buPROPion (WELLBUTRIN XL) 300 MG 24 hr tablet Take 300 mg by mouth daily.      . clonazePAM (KLONOPIN) 0.5 MG tablet Take 0.5 mg by mouth 2 (two) times daily as needed for anxiety. Takes 1 tablet daily.      . furosemide (LASIX) 40 MG tablet Take 1 tablet (40 mg total) by mouth daily.  30 tablet  0  . insulin NPH-regular (HUMULIN 70/30) (70-30) 100 UNIT/ML injection Inject 36 Units into the skin 2 (two) times daily with a meal. And as glucose levels increase, increase insulin as was on 110 units injected twice daily before surgery.  10 mL  12  . PARoxetine (PAXIL) 40 MG tablet Take 40 mg by mouth every morning.        . potassium chloride (K-DUR) 10 MEQ tablet Take 1 tablet (10 mEq total) by mouth 2 (two) times daily.  30 tablet  0  . traMADol (ULTRAM) 50 MG tablet Take 1-2 tablets (50-100 mg total) by mouth every 4 (four) hours as needed for pain.  30 tablet  0   No current facility-administered medications on file prior to visit.     Past Medical History  Diagnosis Date  . HTN (hypertension)   . Gout   . Diabetes mellitus   . Prostate cancer   . Urinary incontinence due to urethral sphincter incompetence   . AAA (abdominal aortic aneurysm)   .  Hyperlipidemia   . CAD (coronary artery disease)     multiple stents to the circ and right coronary artery.  CABG in 08/2012 for severe three-vessel coronary artery disease     Past Surgical History  Procedure Laterality Date  . Coronary stent placement       He had 2.5 x 20-mm Taxus stent placed in the  his circ in August 2007   He had 2.5 x 20-mm Taxus stent placed in the   mid circ and 2.5 x 16-mm stent placed in the distal circ.  [name] Nose surgery    . Prostate surgery  1998    has urinary prosthesis for incontinence  . Appendectomy    . Cholecystectomy    . Ankle fracture surgery    . Above -knee to below knee popliteal bypass  novemeber 2010  . Knee surgery  10-2010  . Back surgery    .  Coronary artery bypass graft N/A 08/27/2012    Procedure: CORONARY ARTERY BYPASS GRAFTING times four using Left Greater Saphenous Vein Graft harvested endoscopically and Left Internal Mammary Artery;  Surgeon: Melrose Nakayama, MD;  Location: [place];  Service: Open Heart Surgery;  Laterality: N/A;  . Tee without cardioversion N/A 08/27/2012    Procedure: TRANSESOPHAGEAL ECHOCARDIOGRAM (TEE);  Surgeon: Melrose Nakayama, MD;  Location: [place];  Service: Open Heart Surgery;  Laterality: N/A;  . Abdominal aortic aneurysm repair  2008    [facility]  . Cardiac catheterization  08/25/2012  . Cardiac catheterization      [place], [place]  . Coronary angioplasty  2006 & 2007    [facility] stent x 2   . Coronary angioplasty with stent placement  2003    [place], [place] stent x 2     Family History  Problem Relation Age of Onset  . Heart attack Mother   . Heart disease Mother   . Hypertension Mother   . Hyperlipidemia Mother   . Heart attack Father 62  . Heart attack Sister 76    MI  . Heart disease Sister      History   Social History  . Marital Status: Married    Spouse Name: N/A    Number of Children: N/A  . Years of Education: N/A   Occupational History  . Not on file.   Social History Main Topics  . Smoking status: Former Smoker -- 2.50 packs/day for 0 years    Types: Cigarettes    Quit date: 02/11/1987  . Smokeless tobacco: Not on file  . Alcohol Use: No  . Drug Use: No  . Sexually Active: Not on file   Other Topics Concern  . Not on file   Social History Narrative   The patient is retired.  He lives in the [place] area.        He enjoys hunting and 4-wheeling.     PHYSICAL EXAM   BP 104/64  Pulse 55  Ht 5\' 6"  (1.676 m)  Wt 263 lb 8 oz (119.523 kg)  BMI 42.55 kg/m2  SpO2 97% Constitutional: He is oriented to person, place, and time. He appears well-developed and well-nourished. No distress.  HENT: No nasal discharge.  Head: Normocephalic and atraumatic.  Eyes: Pupils  are equal and round. Right eye exhibits no discharge. Left eye exhibits no discharge.  Neck: Normal range of motion. Neck supple. No JVD present. No thyromegaly present.  Cardiovascular: Normal rate, regular rhythm, normal heart sounds and. Exam reveals no gallop and no friction  rub. No murmur heard.  Pulmonary/Chest: Effort normal and breath sounds normal. No stridor. No respiratory distress. He has no wheezes. He has no rales. He exhibits no tenderness.  Abdominal: Soft. Bowel sounds are normal. He exhibits no distension. There is no tenderness. There is no rebound and no guarding.  Musculoskeletal: Normal range of motion. He exhibits no edema and no tenderness.  Neurological: He is alert and oriented to person, place, and time. Coordination normal.  Skin: Skin is warm and dry. No rash noted. He is not diaphoretic. No erythema. No pallor.  Psychiatric: He has a normal mood and affect. His behavior is normal. Judgment and thought content normal.       [id number]  Bradycardia  -Poor R-wave progression -may be secondary to pulmonary disease   consider old anterior infarct.   -  Diffuse nonspecific T-abnormality.   Low voltage -possible pulmonary disease.   ABNORMAL    ASSESSMENT AND PLAN

## 2012-09-16 NOTE — Assessment & Plan Note (Signed)
This seems to be his major issue at this time which is preventing him from sleep at night. He is not on an ACE inhibitor. He has symptoms suggestive of post nasal drip and sputum production which could be due to bronchitis. Thus, I decided to treat him with 5 day course of Levaquin 500 mg once daily. I also prescribed Nasonex nasal spray and asked him to use Mucinex as needed.

## 2012-09-16 NOTE — Assessment & Plan Note (Addendum)
He is doing reasonably well after recent CABG. No symptoms of angina. Follow up with Dr. [name] in 1 month.

## 2012-09-16 NOTE — Assessment & Plan Note (Signed)
He is currently on amiodarone, atenolol and digoxin. He is bradycardic and currently in sinus rhythm. Due to that, I recommend stopping digoxin. Amiodarone can likely be stopped in few months.

## 2012-09-16 NOTE — Patient Instructions (Addendum)
Stop taking Digoxin.  Start Levaquin 500 mg once daily for 5 days.  Start Nasonex nasal spray as directed.  Can use Mucinex as needed for cough.   Follow up in 1 month

## 2012-09-20 ENCOUNTER — Other Ambulatory Visit: Payer: Self-pay | Admitting: *Deleted

## 2012-09-20 DIAGNOSIS — I2510 Atherosclerotic heart disease of native coronary artery without angina pectoris: Secondary | ICD-10-CM

## 2012-09-21 ENCOUNTER — Ambulatory Visit
Admission: RE | Admit: 2012-09-21 | Discharge: 2012-09-21 | Disposition: A | Discharge status: Home / Self Care | Payer: Medicare Other | Source: Ambulatory Visit | Attending: Thoracic Surgery (Cardiothoracic Vascular Surgery) | Admitting: Thoracic Surgery (Cardiothoracic Vascular Surgery)

## 2012-09-21 ENCOUNTER — Encounter: Payer: Self-pay | Admitting: Thoracic Surgery (Cardiothoracic Vascular Surgery)

## 2012-09-21 ENCOUNTER — Ambulatory Visit (INDEPENDENT_AMBULATORY_CARE_PROVIDER_SITE_OTHER): Payer: Self-pay | Admitting: Thoracic Surgery (Cardiothoracic Vascular Surgery)

## 2012-09-21 VITALS — BP 102/72 | HR 52 | Resp 20 | Ht 66.0 in | Wt 263.0 lb

## 2012-09-21 DIAGNOSIS — Z951 Presence of aortocoronary bypass graft: Secondary | ICD-10-CM

## 2012-09-21 DIAGNOSIS — I2510 Atherosclerotic heart disease of native coronary artery without angina pectoris: Secondary | ICD-10-CM

## 2012-09-21 MED ORDER — HYDROCOD POLST-CHLORPHEN POLST 10-8 MG/5ML PO LQCR: 5.0000 mL | Freq: Two times a day (BID) | ORAL | Status: DC | PRN

## 2012-09-21 NOTE — Progress Notes (Signed)
HPI:  Max Anderson is a morbidly obese 72 yo gentleman who underwent CABG x 4 on 7/18 for severe 3 vessel CAD. Postoperative course was found to have it fibrillation treated with amiodarone, digoxin, and atenolol. It also was palpated by difficulties with a prosthetic urinary sphincter. This is been deflated by urology prior to surgery. However after trying to reactivate it, it hasn't worked properly. He says he saw a urologist in [place] last week to assess that and is still is not working appropriately. He complains of a frequent cough. Robitussin has not helped. His wife had some research on the Internet and found that amiodarone frequently causes a cough and wondered if that could be stopped.   He saw Dr. Fletcher Anon last week. He gave him Levaquin for 5 days and a nasal spray to see if that would help with a cough. He thinks it has helped a little but. His digoxin was discontinued last week as well.  He has some incisional pain but is not severe. His physical activities are limited by shortness of breath. Should be noted that he was severely deconditioned preoperatively.  Past Medical History  Diagnosis Date  . HTN (hypertension)   . Gout   . Diabetes mellitus   . Prostate cancer   . Urinary incontinence due to urethral sphincter incompetence   . AAA (abdominal aortic aneurysm)   . Hyperlipidemia   . CAD (coronary artery disease)     multiple stents to the circ and right coronary artery.  CABG in 08/2012 for severe three-vessel coronary artery disease      Current Outpatient Prescriptions  Medication Sig Dispense Refill  . albuterol (PROVENTIL HFA;VENTOLIN HFA) 108 (90 BASE) MCG/ACT inhaler Inhale 2 puffs into the lungs every 6 (six) hours as needed for wheezing.  1 Inhaler  2  . allopurinol (ZYLOPRIM) 300 MG tablet Take 300 mg by mouth daily.        [name] amLODipine (NORVASC) 5 MG tablet Take 5 mg by mouth daily.      [name] aspirin EC 325 MG EC tablet Take 1 tablet (325 mg total) by mouth  daily.  30 tablet  0  . atenolol (TENORMIN) 50 MG tablet Take 50 mg by mouth daily.      [name] buPROPion (WELLBUTRIN XL) 300 MG 24 hr tablet Take 300 mg by mouth daily.      . clonazePAM (KLONOPIN) 0.5 MG tablet Take 0.5 mg by mouth 2 (two) times daily as needed for anxiety. Takes 1 tablet daily.      . furosemide (LASIX) 40 MG tablet Take 1 tablet (40 mg total) by mouth daily.  30 tablet  0  . insulin NPH-regular (HUMULIN 70/30) (70-30) 100 UNIT/ML injection Inject 36 Units into the skin 2 (two) times daily with a meal. And as glucose levels increase, increase insulin as was on 110 units injected twice daily before surgery.  10 mL  12  . mometasone (NASONEX) 50 MCG/ACT nasal spray Place 2 sprays into the nose daily.  17 g  1  . PARoxetine (PAXIL) 40 MG tablet Take 40 mg by mouth every morning.        . potassium chloride (K-DUR) 10 MEQ tablet Take 1 tablet (10 mEq total) by mouth 2 (two) times daily.  30 tablet  0  . traMADol (ULTRAM) 50 MG tablet Take 1-2 tablets (50-100 mg total) by mouth every 4 (four) hours as needed for pain.  30 tablet  0  . chlorpheniramine-HYDROcodone (TUSSIONEX PENNKINETIC ER)  10-8 MG/5ML LQCR Take 5 mLs by mouth every 12 (twelve) hours as needed (cough).  140 mL  1   No current facility-administered medications for this visit.    Physical Exam BP 102/72  Pulse 52  Resp 20  Ht 5\' 6"  (1.676 m)  Wt 263 lb (119.296 kg)  BMI 42.47 kg/m2  SpO2 95% Poorly obese 72 year old male in no acute distress Neurologic alert and oriented x3 no focal deficits Cardiac regular rate and rhythm normal S1 and S2 Lungs diminished breath sounds at both bases otherwise clear Sternum stable, incision clean dry and intact Leg incisions healing well with small seroma left lower leg  Diagnostic Tests: Chest x-ray 09/21/12 *RADIOLOGY REPORT*  Clinical Data: Short of breath, cough, history of recent cardiac  surgery, former smoking history, also history of prostate carcinoma  CHEST - 2 VIEW   Comparison: Chest x-ray of 09/08/2012  Findings: Aeration of the lungs has improved. The right pleural  effusion has resolved, with only a small left pleural effusion  remaining. Calcified granulomas are noted on the right.  Cardiomegaly is stable. Median sternotomy sutures are intact.  IMPRESSION:  Improved aeration with only a small left pleural effusion  remaining.  Original Report Authenticated By: Ivar Drape, M.D.   Impression: 72 year old gentleman with multiple medical problems now month out from coronary bypass grafting x4. Her cardiac standpoint he's doing well. Deconditioning is a major problem and is only going to improve with time. His wife is concerned that the amiodarone is causing his coughing. It certainly is a possibility I think it is far enough out at this point we can safely stop the amiodarone. She is told him to stop taking that visit today. I gave him a prescription for Tussionex 5 mL was by mouth twice a day when necessary for cough.  Advised him not to drive a motor vehicle until some of his other issues resolve particularly the cough and urinary incontinence. He should not lift anything over 10 pounds for another 2 weeks.  He has an appointment with Dr. [name] in September.  Plan: DC amiodarone.  Tussionex 5 ML's by mouth twice a day when necessary cough.  Followup with Dr. [name] at scheduled appointment in September.  Call if any issues arise sooner than that.\  Will be happy to see him back any time if I can be of any further assistance with his care.

## 2012-10-26 ENCOUNTER — Encounter (HOSPITAL_COMMUNITY): Payer: Medicare Other

## 2012-11-08 ENCOUNTER — Ambulatory Visit (INDEPENDENT_AMBULATORY_CARE_PROVIDER_SITE_OTHER): Payer: Medicare Other | Admitting: Cardiovascular Disease

## 2012-11-08 ENCOUNTER — Encounter: Payer: Self-pay | Admitting: Cardiovascular Disease

## 2012-11-08 VITALS — BP 138/68 | HR 75 | Ht 66.0 in | Wt 267.0 lb

## 2012-11-08 DIAGNOSIS — R0602 Shortness of breath: Secondary | ICD-10-CM

## 2012-11-08 DIAGNOSIS — I2510 Atherosclerotic heart disease of native coronary artery without angina pectoris: Secondary | ICD-10-CM

## 2012-11-08 DIAGNOSIS — I4891 Unspecified atrial fibrillation: Secondary | ICD-10-CM

## 2012-11-08 DIAGNOSIS — I519 Heart disease, unspecified: Secondary | ICD-10-CM

## 2012-11-08 DIAGNOSIS — E119 Type 2 diabetes mellitus without complications: Secondary | ICD-10-CM

## 2012-11-08 LAB — BASIC METABOLIC PANEL
GFR: 57.77 mL/min — ABNORMAL LOW (ref >60.00)
Glucose, Bld: 328 mg/dL — ABNORMAL HIGH (ref 70–99)
Potassium: 4.3 mEq/L (ref 3.5–5.1)
Sodium: 136 mEq/L (ref 135–145)

## 2012-11-08 LAB — BRAIN NATRIURETIC PEPTIDE: Pro B Natriuretic peptide (BNP): 125 pg/mL — ABNORMAL HIGH (ref 0.0–100.0)

## 2012-11-08 NOTE — Assessment & Plan Note (Signed)
Discussed low carb diet.  Target hemoglobin A1c is 6.5 or less.  Continue current medications.  

## 2012-11-08 NOTE — Progress Notes (Signed)
Patient ID: Max Anderson, male   DOB: July 27, 1940, 72 y.o.   MRN: [id number] Max Anderson is a morbidly obese 72 yo gentleman who underwent CABG x 4 on 7/18 for severe 3 vessel CAD. Postoperative course was found to have it fibrillation treated with amiodarone, digoxin, and atenolol. It also was palpated by difficulties with a prosthetic urinary sphincter. This is been deflated by urology prior to surgery. However after trying to reactivate it, it hasn't worked properly. He says he saw a urologist in [place] last week to assess that and is still is not working appropriately. He complains of a frequent cough. Robitussin has not helped. His wife had some research on the Internet and found that amiodarone frequently causes a cough and wondered if that could be stopped.  He saw Dr. Fletcher Anon a month ago  He gave him Levaquin for 5 days and a nasal spray to see if that would help with a cough. He thinks it has helped a little but. His digoxin was discontinued last week as well.   Echo 08/26/12 Normal EF and mild MR Reviewed  He has some incisional pain but is not severe. His physical activities are limited by shortness of breath. Should be noted that he was severely deconditioned preoperatively. Still with dyspnea Wont do rehab due to cost  Needs f/u with urologist about frequency and prosthetic device   ROS: Denies fever, malais, weight loss, blurry vision, decreased visual acuity, cough, sputum, SOB, hemoptysis, pleuritic pain, palpitaitons, heartburn, abdominal pain, melena, lower extremity edema, claudication, or rash.  All other systems reviewed and negative  General: Affect appropriate Obese white male HEENT: normal Neck supple with no adenopathy JVP normal no bruits no thyromegaly Lungs clear with no wheezing and good diaphragmatic motion Heart:  S1/S2 no murmur, no rub, gallop or click PMI normal Abdomen: benighn, BS positve, no tenderness, no AAA no bruit.  No HSM or HJR Distal pulses intact  with no bruits No edema Neuro non-focal Skin warm and dry No muscular weakness   Current Outpatient Prescriptions  Medication Sig Dispense Refill  . albuterol (PROVENTIL HFA;VENTOLIN HFA) 108 (90 BASE) MCG/ACT inhaler Inhale 2 puffs into the lungs every 6 (six) hours as needed for wheezing.  1 Inhaler  2  . allopurinol (ZYLOPRIM) 300 MG tablet Take 300 mg by mouth daily.        [name] amLODipine (NORVASC) 5 MG tablet Take 5 mg by mouth daily.      [name] aspirin EC 325 MG EC tablet Take 1 tablet (325 mg total) by mouth daily.  30 tablet  0  . atenolol (TENORMIN) 50 MG tablet Take 50 mg by mouth daily.      . clonazePAM (KLONOPIN) 0.5 MG tablet Take 0.5 mg by mouth 2 (two) times daily as needed for anxiety. Takes 1 tablet daily.      . furosemide (LASIX) 40 MG tablet Take 1 tablet (40 mg total) by mouth daily.  30 tablet  0  . insulin NPH-regular (HUMULIN 70/30) (70-30) 100 UNIT/ML injection Inject 36 Units into the skin 2 (two) times daily with a meal. And as glucose levels increase, increase insulin as was on 110 units injected twice daily before surgery.  10 mL  12  . PARoxetine (PAXIL) 40 MG tablet Take 40 mg by mouth every morning.        . potassium chloride (K-DUR) 10 MEQ tablet Take 1 tablet (10 mEq total) by mouth 2 (two) times daily.  30 tablet  0   No current facility-administered medications for this visit.    Allergies  Niaspan; Statins; and Tolectin  Electrocardiogram:  Assessment and Plan

## 2012-11-08 NOTE — Assessment & Plan Note (Signed)
Circumflex MI now post CABG with preserved LV function preop  Given dyspnea will recheck echo and f/u BMET and BNP

## 2012-11-08 NOTE — Assessment & Plan Note (Signed)
Maint NSR with no palpitations Stable

## 2012-11-08 NOTE — Patient Instructions (Signed)
Your physician wants you to follow-up in:   [place] will receive a reminder letter in the mail two months in advance. If you don't receive a letter, please call our office to schedule the follow-up appointment. Your physician recommends that you continue on your current medications as directed. Please refer to the Current Medication list given to you today.  Your physician recommends that you return for lab work in:  [place] has requested that you have an echocardiogram. Echocardiography is a painless test that uses sound waves to create images of your heart. It provides your doctor with information about the size and shape of your heart and how well your heart's chambers and valves are working. This procedure takes approximately one hour. There are no restrictions for this procedure.

## 2012-11-11 ENCOUNTER — Other Ambulatory Visit: Payer: Self-pay | Admitting: Physician Assistant

## 2012-11-15 ENCOUNTER — Ambulatory Visit (HOSPITAL_COMMUNITY)
Admission: RE | Admit: 2012-11-15 | Discharge: 2012-11-15 | Disposition: A | Discharge status: Home / Self Care | Payer: Medicare Other | Source: Ambulatory Visit | Attending: Cardiovascular Disease | Admitting: Cardiovascular Disease

## 2012-11-15 ENCOUNTER — Other Ambulatory Visit: Payer: Self-pay | Admitting: *Deleted

## 2012-11-15 DIAGNOSIS — IMO0002 Reserved for concepts with insufficient information to code with codable children: Secondary | ICD-10-CM | POA: Insufficient documentation

## 2012-11-15 DIAGNOSIS — R0602 Shortness of breath: Secondary | ICD-10-CM

## 2012-11-15 DIAGNOSIS — R0989 Other specified symptoms and signs involving the circulatory and respiratory systems: Secondary | ICD-10-CM | POA: Insufficient documentation

## 2012-11-15 DIAGNOSIS — I4891 Unspecified atrial fibrillation: Secondary | ICD-10-CM | POA: Insufficient documentation

## 2012-11-15 DIAGNOSIS — E1165 Type 2 diabetes mellitus with hyperglycemia: Secondary | ICD-10-CM | POA: Insufficient documentation

## 2012-11-15 DIAGNOSIS — Z6841 Body Mass Index (BMI) 40.0 and over, adult: Secondary | ICD-10-CM | POA: Insufficient documentation

## 2012-11-15 DIAGNOSIS — R0609 Other forms of dyspnea: Secondary | ICD-10-CM | POA: Insufficient documentation

## 2012-11-15 DIAGNOSIS — I517 Cardiomegaly: Secondary | ICD-10-CM

## 2012-11-15 NOTE — Progress Notes (Signed)
*  PRELIMINARY RESULTS* Echocardiogram 2D Echocardiogram has been performed.  [place], [place] 11/15/2012, 3:45 PM

## 2012-11-17 ENCOUNTER — Other Ambulatory Visit: Payer: Self-pay | Admitting: Physician Assistant

## 2012-11-19 ENCOUNTER — Other Ambulatory Visit: Payer: Self-pay | Admitting: *Deleted

## 2012-11-19 MED ORDER — FUROSEMIDE 40 MG PO TABS: 40.0000 mg | ORAL_TABLET | Freq: Every day | ORAL | Status: DC

## 2012-11-19 MED ORDER — FUROSEMIDE 40 MG PO TABS: 40.0000 mg | ORAL_TABLET | Freq: Every day | ORAL | Status: AC

## 2012-12-16 ENCOUNTER — Other Ambulatory Visit: Payer: Self-pay

## 2013-05-09 ENCOUNTER — Ambulatory Visit (INDEPENDENT_AMBULATORY_CARE_PROVIDER_SITE_OTHER): Payer: Commercial Managed Care - HMO | Admitting: Cardiovascular Disease

## 2013-05-09 ENCOUNTER — Encounter: Payer: Self-pay | Admitting: Cardiovascular Disease

## 2013-05-09 VITALS — BP 140/70 | HR 76 | Ht 67.0 in | Wt 289.0 lb

## 2013-05-09 DIAGNOSIS — I2510 Atherosclerotic heart disease of native coronary artery without angina pectoris: Secondary | ICD-10-CM

## 2013-05-09 DIAGNOSIS — E119 Type 2 diabetes mellitus without complications: Secondary | ICD-10-CM

## 2013-05-09 DIAGNOSIS — I10 Essential (primary) hypertension: Secondary | ICD-10-CM

## 2013-05-09 NOTE — Progress Notes (Signed)
Patient ID: DAGO MOM, male   DOB: 1940-03-05, 73 y.o.   MRN: [id number] Mr. Migliore is a morbidly obese 73 yo gentleman who underwent CABG x 4 on 7/18 for severe 3 vessel CAD. Postoperative course was found to have it fibrillation treated with amiodarone, digoxin, and atenolol. It also was palpated by difficulties with a prosthetic urinary sphincter. This is been deflated by urology prior to surgery. However after trying to reactivate it, it hasn't worked properly. He says he saw a urologist in [place] last week to assess that and is still is not working appropriately. He complains of a frequent cough. Robitussin has not helped. His wife had some research on the Internet and found that amiodarone frequently causes a cough and wondered if that could be stopped.  He saw Dr. Fletcher Anon a month ago He gave him Levaquin for 5 days and a nasal spray to see if that would help with a cough. He thinks it has helped a little but. His digoxin was discontinued last week as well.   Echo 08/26/12  Normal EF and mild MR Reviewed   Still severely deconditioned  Dry skin  Bladder still not perfect but he does not want to f/u with urology     ROS: Denies fever, malais, weight loss, blurry vision, decreased visual acuity, cough, sputum, SOB, hemoptysis, pleuritic pain, palpitaitons, heartburn, abdominal pain, melena, lower extremity edema, claudication, or rash.  All other systems reviewed and negative  General: Affect appropriate Obese white male  HEENT: normal Neck supple with no adenopathy JVP normal no bruits no thyromegaly Lungs clear with no wheezing and good diaphragmatic motion Heart:  S1/S2 no murmur, no rub, gallop or click PMI normal Abdomen: benighn, BS positve, no tenderness, no AAA no bruit.  No HSM or HJR Distal pulses intact with no bruits No edema Neuro non-focal Skin warm and dry No muscular weakness   Current Outpatient Prescriptions  Medication Sig Dispense Refill  . allopurinol  (ZYLOPRIM) 300 MG tablet Take 300 mg by mouth daily.        [name] amLODipine (NORVASC) 5 MG tablet Take 5 mg by mouth daily.      [name] aspirin EC 325 MG EC tablet Take 1 tablet (325 mg total) by mouth daily.  30 tablet  0  . atenolol (TENORMIN) 50 MG tablet Take 50 mg by mouth daily.      . clonazePAM (KLONOPIN) 0.5 MG tablet Take 0.5 mg by mouth 2 (two) times daily as needed for anxiety. Takes 1 tablet daily.      . furosemide (LASIX) 40 MG tablet Take 1 tablet (40 mg total) by mouth daily.  30 tablet  11  . insulin NPH-regular Human (NOVOLIN 70/30) (70-30) 100 UNIT/ML injection Inject 100 Units into the skin 2 (two) times daily with a meal. And as glucose levels increase, increase insulin as was on 110 units injected twice daily before surgery.      [name] PARoxetine (PAXIL) 40 MG tablet Take 40 mg by mouth every morning.        . potassium chloride (K-DUR) 10 MEQ tablet Take 1 tablet (10 mEq total) by mouth 2 (two) times daily.  30 tablet  0   No current facility-administered medications for this visit.    Allergies  Niaspan; Statins; and Tolectin  Electrocardiogram: 09/16/12  SR poor R wave progression no acute change   Assessment and Plan

## 2013-05-09 NOTE — Patient Instructions (Signed)
Your physician wants you to follow-up in:  6 MONTHS WITH DR NISHAN  You will receive a reminder letter in the mail two months in advance. If you don't receive a letter, please call our office to schedule the follow-up appointment. Your physician recommends that you continue on your current medications as directed. Please refer to the Current Medication list given to you today. 

## 2013-05-09 NOTE — Assessment & Plan Note (Signed)
Stable with no angina and good activity level.  Continue medical Rx  

## 2013-05-09 NOTE — Assessment & Plan Note (Signed)
Patient somewhat indifferent to control and morbid obesity  BS running high Link to bypass longevity discussed

## 2013-05-09 NOTE — Assessment & Plan Note (Signed)
Well controlled.  Continue current medications and low sodium Dash type diet.    

## 2013-11-01 ENCOUNTER — Encounter: Payer: Self-pay | Admitting: Cardiovascular Disease

## 2013-11-01 ENCOUNTER — Ambulatory Visit (INDEPENDENT_AMBULATORY_CARE_PROVIDER_SITE_OTHER): Payer: Commercial Managed Care - HMO | Admitting: Cardiovascular Disease

## 2013-11-01 VITALS — BP 118/70 | HR 54 | Ht 67.0 in | Wt 295.1 lb

## 2013-11-01 DIAGNOSIS — I2510 Atherosclerotic heart disease of native coronary artery without angina pectoris: Secondary | ICD-10-CM | POA: Diagnosis not present

## 2013-11-01 DIAGNOSIS — E119 Type 2 diabetes mellitus without complications: Secondary | ICD-10-CM

## 2013-11-01 DIAGNOSIS — E7800 Pure hypercholesterolemia, unspecified: Secondary | ICD-10-CM | POA: Insufficient documentation

## 2013-11-01 DIAGNOSIS — R32 Unspecified urinary incontinence: Secondary | ICD-10-CM

## 2013-11-01 DIAGNOSIS — I10 Essential (primary) hypertension: Secondary | ICD-10-CM | POA: Diagnosis not present

## 2013-11-01 DIAGNOSIS — N3642 Intrinsic sphincter deficiency (ISD): Secondary | ICD-10-CM

## 2013-11-01 DIAGNOSIS — I739 Peripheral vascular disease, unspecified: Secondary | ICD-10-CM

## 2013-11-01 NOTE — Assessment & Plan Note (Signed)
Discussed low carb diet.  Target hemoglobin A1c is 6.5 or less.  Continue current medications.  

## 2013-11-01 NOTE — Assessment & Plan Note (Signed)
No recent labs  PVD and CABG Intolerant to statins  Check labs in [place] and refer to lipid clinic.  If LDL over 130 consider PSK9 Rx

## 2013-11-01 NOTE — Assessment & Plan Note (Signed)
2010 right distal fem pop bypass for aneurysm  ABI's normal in 2014  F/U Dr Donnetta Hutching

## 2013-11-01 NOTE — Progress Notes (Signed)
Patient ID: Max Anderson, male   DOB: 1940/10/19, 73 y.o.   MRN: [id number] Max Anderson is a morbidly obese 73 yo gentleman who underwent CABG x 4 on 7/18 for severe 3 vessel CAD. Postoperative course was found to have it fibrillation treated with amiodarone, digoxin, and atenolol. It also was palpated by difficulties with a prosthetic urinary sphincter. This is been deflated by urology prior to surgery. However after trying to reactivate it, it hasn't worked properly. He says he saw a urologist in [place] last week to assess that and is still is not working appropriately. He complains of a frequent cough. Robitussin has not helped. His wife had some research on the Internet and found that amiodarone frequently causes a cough and wondered if that could be stopped.  He saw Dr. Fletcher Anon a month ago He gave him Levaquin for 5 days and a nasal spray to see if that would help with a cough. He thinks it has helped a little but. His digoxin was discontinued last week as well.  Echo 11/15/12  Normal EF and mild MR Reviewed  Study Conclusions  - Left ventricle: The cavity size was normal. Wall thickness was increased in a pattern of mild LVH. Systolic function was normal. The estimated ejection fraction was in the range of 60% to 65%. Doppler parameters are consistent with abnormal left ventricular relaxation (grade 1 diastolic dysfunction). - Pulmonary arteries: PA peak pressure: 34mm Hg (S).    Still severely deconditioned Dry skin Malfunctioning penile prosthesis that urology won't revise  Stress incontinence   Baseline CR 1.4  Intolerant to statins    ROS: Denies fever, malais, weight loss, blurry vision, decreased visual acuity, cough, sputum, SOB, hemoptysis, pleuritic pain, palpitaitons, heartburn, abdominal pain, melena, lower extremity edema, claudication, or rash.  All other systems reviewed and negative  General: Affect appropriate Obese white male  HEENT: normal Neck supple with no  adenopathy JVP normal no bruits no thyromegaly Lungs clear with no wheezing and good diaphragmatic motion Heart:  S1/S2 no murmur, no rub, gallop or click PMI normal Abdomen: benighn, BS positve, no tenderness, no AAA no bruit.  No HSM or HJR Distal pulses intact with no bruits No edema Neuro non-focal Skin warm and dry No muscular weakness   Current Outpatient Prescriptions  Medication Sig Dispense Refill  . allopurinol (ZYLOPRIM) 300 MG tablet Take 300 mg by mouth daily.        [name] amLODipine (NORVASC) 5 MG tablet Take 5 mg by mouth daily.      [name] aspirin EC 325 MG EC tablet Take 1 tablet (325 mg total) by mouth daily.  30 tablet  0  . atenolol (TENORMIN) 50 MG tablet Take 50 mg by mouth daily.      . clonazePAM (KLONOPIN) 0.5 MG tablet Take 0.5 mg by mouth 2 (two) times daily as needed for anxiety. Takes 1 tablet daily.      . furosemide (LASIX) 40 MG tablet Take 1 tablet (40 mg total) by mouth daily.  30 tablet  11  . HYDROcodone-acetaminophen (NORCO) 10-325 MG per tablet Take 1 tablet by mouth every 6 (six) hours as needed for moderate pain.      [name] insulin NPH-regular Human (NOVOLIN 70/30) (70-30) 100 UNIT/ML injection Inject 100 Units into the skin 2 (two) times daily with a meal. And as glucose levels increase, increase insulin as was on 110 units injected twice daily before surgery.      [name] losartan (COZAAR) 100 MG tablet  Take 100 mg by mouth daily.       [name] PARoxetine (PAXIL) 40 MG tablet Take 40 mg by mouth every morning.        . potassium chloride (K-DUR) 10 MEQ tablet Take 1 tablet (10 mEq total) by mouth 2 (two) times daily.  30 tablet  0   No current facility-administered medications for this visit.    Allergies  Lipitor; Niaspan; Statins; Tolectin; and Zocor  Electrocardiogram:  09/16/12 SR low voltage nonspecific ST changes   Assessment and Plan

## 2013-11-01 NOTE — Assessment & Plan Note (Signed)
Encouraged him to f/u with urology  Needs to loose weight but no contraindications to general anesthesia or surgery from cardiac perspective

## 2013-11-01 NOTE — Assessment & Plan Note (Signed)
Well controlled.  Continue current medications and low sodium Dash type diet.    

## 2013-11-01 NOTE — Assessment & Plan Note (Signed)
Stable with no angina and good activity level.  Continue medical Rx  

## 2013-11-01 NOTE — Patient Instructions (Signed)
Your physician wants you to follow-up in:   [place] [facility] You will receive a reminder letter in the mail two months in advance. If you don't receive a letter, please call our office to schedule the follow-up appointment.  Your physician recommends that you continue on your current medications as directed. Please refer to the Current Medication list given to you today.

## 2013-11-07 ENCOUNTER — Other Ambulatory Visit: Payer: Self-pay | Admitting: Cardiovascular Disease

## 2013-11-08 LAB — LIPID PANEL
CHOL/HDL RATIO: 11.3 ratio
CHOLESTEROL: 315 mg/dL — AB (ref 0–200)
HDL: 28 mg/dL — ABNORMAL LOW (ref >39)
TRIGLYCERIDES: 1246 mg/dL — AB (ref <150)

## 2013-11-08 LAB — BASIC METABOLIC PANEL
BUN: 17 mg/dL (ref 6–23)
CO2: 28 mEq/L (ref 19–32)
Calcium: 8.7 mg/dL (ref 8.4–10.5)
Chloride: 99 mEq/L (ref 96–112)
Creat: 1.09 mg/dL (ref 0.50–1.35)
Glucose, Bld: 161 mg/dL — ABNORMAL HIGH (ref 70–99)
POTASSIUM: 4.3 meq/L (ref 3.5–5.3)
SODIUM: 138 meq/L (ref 135–145)

## 2013-11-08 LAB — HEPATIC FUNCTION PANEL
ALT: 19 U/L (ref 0–53)
AST: 19 U/L (ref 0–37)
Albumin: 3.8 g/dL (ref 3.5–5.2)
Alkaline Phosphatase: 134 U/L — ABNORMAL HIGH (ref 39–117)
BILIRUBIN DIRECT: 0.1 mg/dL (ref 0.0–0.3)
BILIRUBIN INDIRECT: 0.3 mg/dL (ref 0.2–1.2)
Total Bilirubin: 0.4 mg/dL (ref 0.2–1.2)
Total Protein: 6.3 g/dL (ref 6.0–8.3)

## 2013-11-09 ENCOUNTER — Telehealth: Payer: Self-pay | Admitting: Cardiovascular Disease

## 2013-11-09 ENCOUNTER — Other Ambulatory Visit: Payer: Self-pay

## 2013-11-09 ENCOUNTER — Telehealth: Payer: Self-pay

## 2013-11-09 MED ORDER — FENOFIBRATE 160 MG PO TABS: 160.0000 mg | ORAL_TABLET | Freq: Every day | ORAL | Status: DC

## 2013-11-09 MED ORDER — FENOFIBRATE 160 MG PO TABS: 160.0000 mg | ORAL_TABLET | Freq: Every day | ORAL | Status: AC

## 2013-11-09 NOTE — Telephone Encounter (Signed)
New message     Pt is returning a nurses call.  He now want to try the medication Dr [name] suggested.

## 2013-11-09 NOTE — Telephone Encounter (Signed)
Patient's wife returned phone call to office to inform us that her husband will try Tricor again. Instructed her that the medication would be sent to the pharmacy and to call the office with any complaints or unwanted side effects.

## 2013-11-09 NOTE — Telephone Encounter (Signed)
Called patient to inform him of lab results and of new prescription for Tricor.  Patient refuses to take Tricor, stating that he has tried several times and it makes him sick and lethargic.  Patient wants another option for lowering cholesterol.

## 2013-11-09 NOTE — Telephone Encounter (Signed)
Called patient to inform that the medication has already been called in to the pharmacy.

## 2013-11-09 NOTE — Telephone Encounter (Signed)
Follow up      Pt want to try tricor again.  Please call wife when presc has been called in

## 2013-11-10 NOTE — Telephone Encounter (Signed)
Start lopid 600 mg bid take 30 minutes before breakfast and dinner

## 2013-11-22 ENCOUNTER — Ambulatory Visit: Payer: Commercial Managed Care - HMO | Admitting: Pharmacist

## 2013-11-29 ENCOUNTER — Ambulatory Visit: Payer: Commercial Managed Care - HMO | Admitting: Pharmacist

## 2013-11-29 ENCOUNTER — Telehealth: Payer: Self-pay | Admitting: Cardiovascular Disease

## 2013-11-29 NOTE — Telephone Encounter (Signed)
FYI, Pt prefers not to go to the lipid clinic. Pt wants to work on his cholesterol by eliminating processed foods.

## 2013-11-29 NOTE — Telephone Encounter (Signed)
Ok

## 2013-11-29 NOTE — Telephone Encounter (Signed)
New problem   Pt need to speak to nurse concerning being put on a diet to get his levels down b/f he come in for lipid clinic.

## 2013-11-30 NOTE — Telephone Encounter (Signed)
lmtcb ./cy 

## 2013-12-02 NOTE — Telephone Encounter (Signed)
LMTCB ./CY 

## 2013-12-12 NOTE — Telephone Encounter (Signed)
PT NOTIFIED .[place]

## 2013-12-12 NOTE — Telephone Encounter (Signed)
SPOKE WITH  PT  HAS  STARTED TRICOR  AS STATED IN PREVIOUS  MESSAGE  SO FAR IS TOLERATING   WILL CONT WITH  TRICOR PER  PT  DID NOT  TOLERATE  LOPID .[facility] Housekeeper

## 2014-01-16 ENCOUNTER — Encounter: Payer: Self-pay | Admitting: Pharmacist

## 2014-01-19 ENCOUNTER — Encounter (HOSPITAL_COMMUNITY): Payer: Self-pay | Admitting: Cardiovascular Disease

## 2014-01-26 ENCOUNTER — Encounter (INDEPENDENT_AMBULATORY_CARE_PROVIDER_SITE_OTHER): Payer: Commercial Managed Care - HMO | Admitting: Ophthalmology

## 2014-01-26 DIAGNOSIS — E11329 Type 2 diabetes mellitus with mild nonproliferative diabetic retinopathy without macular edema: Secondary | ICD-10-CM

## 2014-01-26 DIAGNOSIS — E11319 Type 2 diabetes mellitus with unspecified diabetic retinopathy without macular edema: Secondary | ICD-10-CM

## 2014-01-26 DIAGNOSIS — H35033 Hypertensive retinopathy, bilateral: Secondary | ICD-10-CM

## 2014-01-26 DIAGNOSIS — H3531 Nonexudative age-related macular degeneration: Secondary | ICD-10-CM

## 2014-01-26 DIAGNOSIS — H43813 Vitreous degeneration, bilateral: Secondary | ICD-10-CM

## 2014-01-26 DIAGNOSIS — I10 Essential (primary) hypertension: Secondary | ICD-10-CM

## 2014-01-26 DIAGNOSIS — H2513 Age-related nuclear cataract, bilateral: Secondary | ICD-10-CM

## 2014-01-27 ENCOUNTER — Ambulatory Visit (INDEPENDENT_AMBULATORY_CARE_PROVIDER_SITE_OTHER): Payer: Commercial Managed Care - HMO | Admitting: Pharmacist

## 2014-01-27 DIAGNOSIS — E7800 Pure hypercholesterolemia, unspecified: Secondary | ICD-10-CM

## 2014-01-27 NOTE — Patient Instructions (Addendum)
Continue your Tricor every day.   Try to cut down on just one serving of food at each meal.    Recheck labs in 4 months.   Have Dr. Ria Comment office fax your lab results to [facility] at [phone]

## 2014-02-06 NOTE — Progress Notes (Signed)
HPI  Mr., Max Anderson is a 73 yo M pt of Dr. [name] who was referred to the lipid clinic for elevated TG.  His originally appointment was made in September when his TG were 1246.  He was started on fenofibrate at that time and stated he would cut out processed foods rather than come to the lipid clinic.  In December, his TG remained elevated at 765 despite these changes so he made an appt to come into the clinic.  Pt's PMH significant for CAD s/p CABG x 4, PVD, DM, and HTN.  His A1c in December was 8.3 and pt states his CBGs are around 313 fasting.    RF: CAD, PVD, DM, HTN, age, HDL<40 Goals: LDL 70mg /dL, non-HDL <100 mg/dL Current Medications: fenofibrate 160mg  daily Intolerances: Crestor, Lipitor and Zocor- flu like symptoms, Niaspan- whelps and SOB, Zetia- GI upset, fish oil 4gm/day- diarrhea  Social History- no smoking or alcohol  Dietary Review: Breakfast: ham and eggs (2 eggs/day), [place] sausage, toast, 1-2 cupps of coffee with 2 cups creamer and 1 Splenda Lunch: often skips but may have a sandwich or leftovers with a soft drink Dinner: fried chicken, pork ribs, shrimp or white fish, green peas, dumplings, sweet potates, green beans, potatoes, cabbage, corn.  He likes to go out to eat at [facility].  He admits to eating multiple servings at dinner.  Sweets: he likes to eat sweets as often as he can get it, especially ice cream.   Labs:  01/2014: TC 308, TG 765, HDL 28, LDL not calculated, A1c 8.3 (fenofibrate)   Current Outpatient Prescriptions  Medication Sig Dispense Refill  . allopurinol (ZYLOPRIM) 300 MG tablet Take 300 mg by mouth daily.      [name] amLODipine (NORVASC) 5 MG tablet Take 5 mg by mouth daily.    [name] aspirin EC 325 MG EC tablet Take 1 tablet (325 mg total) by mouth daily. 30 tablet 0  . atenolol (TENORMIN) 50 MG tablet Take 50 mg by mouth daily.    . clonazePAM (KLONOPIN) 0.5 MG tablet Take 0.5 mg by mouth 2 (two) times daily as needed for anxiety. Takes 1 tablet daily.     . fenofibrate 160 MG tablet Take 1 tablet (160 mg total) by mouth daily. 30 tablet 1  . furosemide (LASIX) 40 MG tablet Take 1 tablet (40 mg total) by mouth daily. 30 tablet 11  . HYDROcodone-acetaminophen (NORCO) 10-325 MG per tablet Take 1 tablet by mouth every 6 (six) hours as needed for moderate pain.    [name] insulin NPH-regular Human (NOVOLIN 70/30) (70-30) 100 UNIT/ML injection Inject 100 Units into the skin 2 (two) times daily with a meal. And as glucose levels increase, increase insulin as was on 110 units injected twice daily before surgery.    [name] losartan (COZAAR) 100 MG tablet Take 100 mg by mouth daily.     [name] PARoxetine (PAXIL) 40 MG tablet Take 40 mg by mouth every morning.      . potassium chloride (K-DUR) 10 MEQ tablet Take 1 tablet (10 mEq total) by mouth 2 (two) times daily. 30 tablet 0   No current facility-administered medications for this visit.   Allergies  Allergen Reactions  . Lipitor [Atorvastatin]   . Niaspan [Niacin]   . Statins     Myalgias  . Tolectin [Tolmetin Sodium]   . Zocor [Simvastatin]

## 2014-02-06 NOTE — Assessment & Plan Note (Signed)
Pt has extremely elevated TG despite treatment with fenofibrate.  They have improved but still way above 500.  Elevation is likely due to elevated A1c.  Unfortunately he is intolerant to all other cholesterol medications that may improve TG.  Will need to focus on lifestyle improvements and lowering blood sugar to control TG.  Made several suggestions on dietary changes but pt is not very keen on making these changes.  Suggested just changing the amount of food he eats at first rather than what he eats and see if that will help.  He is willing to try this.  Will follow up with him after his next visit with PCP.

## 2014-03-24 ENCOUNTER — Encounter: Payer: Self-pay | Admitting: Family

## 2014-03-28 ENCOUNTER — Ambulatory Visit: Payer: Commercial Managed Care - HMO | Admitting: Family

## 2014-04-07 ENCOUNTER — Encounter: Payer: Self-pay | Admitting: Family

## 2014-04-10 ENCOUNTER — Ambulatory Visit: Payer: Commercial Managed Care - HMO | Admitting: Family

## 2014-04-24 ENCOUNTER — Encounter: Payer: Self-pay | Admitting: Vascular Surgery

## 2014-04-25 ENCOUNTER — Ambulatory Visit (INDEPENDENT_AMBULATORY_CARE_PROVIDER_SITE_OTHER): Payer: Commercial Managed Care - HMO | Admitting: Family

## 2014-04-25 ENCOUNTER — Encounter: Payer: Self-pay | Admitting: Family

## 2014-04-25 VITALS — BP 103/65 | HR 58 | Resp 16 | Ht 67.0 in | Wt 289.0 lb

## 2014-04-25 DIAGNOSIS — I739 Peripheral vascular disease, unspecified: Secondary | ICD-10-CM | POA: Diagnosis not present

## 2014-04-25 DIAGNOSIS — E1151 Type 2 diabetes mellitus with diabetic peripheral angiopathy without gangrene: Secondary | ICD-10-CM

## 2014-04-25 DIAGNOSIS — Z9889 Other specified postprocedural states: Secondary | ICD-10-CM | POA: Diagnosis not present

## 2014-04-25 DIAGNOSIS — Z87891 Personal history of nicotine dependence: Secondary | ICD-10-CM

## 2014-04-25 DIAGNOSIS — E1159 Type 2 diabetes mellitus with other circulatory complications: Secondary | ICD-10-CM

## 2014-04-25 DIAGNOSIS — Z95828 Presence of other vascular implants and grafts: Secondary | ICD-10-CM
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
## 2014-04-25 NOTE — Patient Instructions (Signed)
Abdominal Aortic Aneurysm An aneurysm is a weakened or damaged part of an artery wall that bulges from the normal force of blood pumping through the body. An abdominal aortic aneurysm is an aneurysm that occurs in the lower part of the aorta, the main artery of the body.  The major concern with an abdominal aortic aneurysm is that it can enlarge and burst (rupture) or blood can flow between the layers of the wall of the aorta through a tear (aorticdissection). Both of these conditions can cause bleeding inside the body and can be life threatening unless diagnosed and treated promptly. CAUSES  The exact cause of an abdominal aortic aneurysm is unknown. Some contributing factors are:   A hardening of the arteries caused by the buildup of fat and other substances in the lining of a blood vessel (arteriosclerosis).  Inflammation of the walls of an artery (arteritis).   Connective tissue diseases, such as Marfan syndrome.   Abdominal trauma.   An infection, such as syphilis or staphylococcus, in the wall of the aorta (infectious aortitis) caused by bacteria. RISK FACTORS  Risk factors that contribute to an abdominal aortic aneurysm may include:  Age older than 60 years.   High blood pressure (hypertension).  Male gender.  Ethnicity (white race).  Obesity.  Family history of aneurysm (first degree relatives only).  Tobacco use. PREVENTION  The following healthy lifestyle habits may help decrease your risk of abdominal aortic aneurysm:  Quitting smoking. Smoking can raise your blood pressure and cause arteriosclerosis.  Limiting or avoiding alcohol.  Keeping your blood pressure, blood sugar level, and cholesterol levels within normal limits.  Decreasing your salt intake. In somepeople, too much salt can raise blood pressure and increase your risk of abdominal aortic aneurysm.  Eating a diet low in saturated fats and cholesterol.  Increasing your fiber intake by including  whole grains, vegetables, and fruits in your diet. Eating these foods may help lower blood pressure.  Maintaining a healthy weight.  Staying physically active and exercising regularly. SYMPTOMS  The symptoms of abdominal aortic aneurysm may vary depending on the size and rate of growth of the aneurysm.Most grow slowly and do not have any symptoms. When symptoms do occur, they may include:  Pain (abdomen, side, lower back, or groin). The pain may vary in intensity. A sudden onset of severe pain may indicate that the aneurysm has ruptured.  Feeling full after eating only small amounts of food.  Nausea or vomiting or both.  Feeling a pulsating lump in the abdomen.  Feeling faint or passing out. DIAGNOSIS  Since most unruptured abdominal aortic aneurysms have no symptoms, they are often discovered during diagnostic exams for other conditions. An aneurysm may be found during the following procedures:  Ultrasonography (A one-time screening for abdominal aortic aneurysm by ultrasonography is also recommended for all men aged 65-75 years who have ever smoked).  X-ray exams.  A computed tomography (CT).  Magnetic resonance imaging (MRI).  Angiography or arteriography. TREATMENT  Treatment of an abdominal aortic aneurysm depends on the size of your aneurysm, your age, and risk factors for rupture. Medication to control blood pressure and pain may be used to manage aneurysms smaller than 6 cm. Regular monitoring for enlargement may be recommended by your caregiver if:  The aneurysm is 3-4 cm in size (an annual ultrasonography may be recommended).  The aneurysm is 4-4.5 cm in size (an ultrasonography every 6 months may be recommended).  The aneurysm is larger than 4.5 cm in   size (your caregiver may ask that you be examined by a vascular surgeon). If your aneurysm is larger than 6 cm, surgical repair may be recommended. There are two main methods for repair of an aneurysm:   Endovascular  repair (a minimally invasive surgery). This is done most often.  Open repair. This method is used if an endovascular repair is not possible. Document Released: 11/06/2004 Document Revised: 05/24/2012 Document Reviewed: 02/27/2012 ExitCare Patient Information 2015 [employer]. This information is not intended to replace advice given to you by your health care provider. Make sure you discuss any questions you have with your health care provider.   Peripheral Vascular Disease Peripheral Vascular Disease (PVD), also called Peripheral Arterial Disease (PAD), is a circulation problem caused by cholesterol (atherosclerotic plaque) deposits in the arteries. PVD commonly occurs in the lower extremities (legs) but it can occur in other areas of the body, such as your arms. The cholesterol buildup in the arteries reduces blood flow which can cause pain and other serious problems. The presence of PVD can place a person at risk for Coronary Artery Disease (CAD).  CAUSES  Causes of PVD can be many. It is usually associated with more than one risk factor such as:   High Cholesterol.  Smoking.  Diabetes.  Lack of exercise or inactivity.  High blood pressure (hypertension).  Obesity.  Family history. SYMPTOMS   When the lower extremities are affected, patients with PVD may experience:  Leg pain with exertion or physical activity. This is called INTERMITTENT CLAUDICATION. This may present as cramping or numbness with physical activity. The location of the pain is associated with the level of blockage. For example, blockage at the abdominal level (distal abdominal aorta) may result in buttock or hip pain. Lower leg arterial blockage may result in calf pain.  As PVD becomes more severe, pain can develop with less physical activity.  In people with severe PVD, leg pain may occur at rest.  Other PVD signs and symptoms:  Leg numbness or weakness.  Coldness in the affected leg or foot, especially  when compared to the other leg.  A change in leg color.  Patients with significant PVD are more prone to ulcers or sores on toes, feet or legs. These may take longer to heal or may reoccur. The ulcers or sores can become infected.  If signs and symptoms of PVD are ignored, gangrene may occur. This can result in the loss of toes or loss of an entire limb.  Not all leg pain is related to PVD. Other medical conditions can cause leg pain such as:  Blood clots (embolism) or Deep Vein Thrombosis.  Inflammation of the blood vessels (vasculitis).  Spinal stenosis. DIAGNOSIS  Diagnosis of PVD can involve several different types of tests. These can include:  Pulse Volume Recording Method (PVR). This test is simple, painless and does not involve the use of X-rays. PVR involves measuring and comparing the blood pressure in the arms and legs. An ABI (Ankle-Brachial Index) is calculated. The normal ratio of blood pressures is 1. As this number becomes smaller, it indicates more severe disease.  < 0.95 - indicates significant narrowing in one or more leg vessels.  <0.8 - there will usually be pain in the foot, leg or buttock with exercise.  <0.4 - will usually have pain in the legs at rest.  <0.25 - usually indicates limb threatening PVD.  Doppler detection of pulses in the legs. This test is painless and checks to see if   you have a pulses in your legs/feet.  A dye or contrast material (a substance that highlights the blood vessels so they show up on x-ray) may be given to help your caregiver better see the arteries for the following tests. The dye is eliminated from your body by the kidney's. Your caregiver may order blood work to check your kidney function and other laboratory values before the following tests are performed:  Magnetic Resonance Angiography (MRA). An MRA is a picture study of the blood vessels and arteries. The MRA machine uses a large magnet to produce images of the blood  vessels.  Computed Tomography Angiography (CTA). A CTA is a specialized x-ray that looks at how the blood flows in your blood vessels. An IV may be inserted into your arm so contrast dye can be injected.  Angiogram. Is a procedure that uses x-rays to look at your blood vessels. This procedure is minimally invasive, meaning a small incision (cut) is made in your groin. A small tube (catheter) is then inserted into the artery of your groin. The catheter is guided to the blood vessel or artery your caregiver wants to examine. Contrast dye is injected into the catheter. X-rays are then taken of the blood vessel or artery. After the images are obtained, the catheter is taken out. TREATMENT  Treatment of PVD involves many interventions which may include:  Lifestyle changes:  Quitting smoking.  Exercise.  Following a low fat, low cholesterol diet.  Control of diabetes.  Foot care is very important to the PVD patient. Good foot care can help prevent infection.  Medication:  Cholesterol-lowering medicine.  Blood pressure medicine.  Anti-platelet drugs.  Certain medicines may reduce symptoms of Intermittent Claudication.  Interventional/Surgical options:  Angioplasty. An Angioplasty is a procedure that inflates a balloon in the blocked artery. This opens the blocked artery to improve blood flow.  Stent Implant. A wire mesh tube (stent) is placed in the artery. The stent expands and stays in place, allowing the artery to remain open.  Peripheral Bypass Surgery. This is a surgical procedure that reroutes the blood around a blocked artery to help improve blood flow. This type of procedure may be performed if Angioplasty or stent implants are not an option. SEEK IMMEDIATE MEDICAL CARE IF:   You develop pain or numbness in your arms or legs.  Your arm or leg turns cold, becomes blue in color.  You develop redness, warmth, swelling and pain in your arms or legs. MAKE SURE YOU:    Understand these instructions.  Will watch your condition.  Will get help right away if you are not doing well or get worse. Document Released: 03/06/2004 Document Revised: 04/21/2011 Document Reviewed: 02/01/2008 ExitCare Patient Information 2015 [employer]. This information is not intended to replace advice given to you by your health care provider. Make sure you discuss any questions you have with your health care provider.  

## 2014-04-26 NOTE — Addendum Note (Signed)
Addended by: Mena Goes on: 04/26/2014 10:10 AM   Modules accepted: Orders

## 2014-07-27 ENCOUNTER — Ambulatory Visit (HOSPITAL_COMMUNITY): Payer: Commercial Managed Care - HMO

## 2014-08-01 ENCOUNTER — Encounter (HOSPITAL_COMMUNITY): Payer: Commercial Managed Care - HMO

## 2014-08-01 ENCOUNTER — Ambulatory Visit: Payer: Commercial Managed Care - HMO | Admitting: Vascular Surgery

## 2014-08-01 ENCOUNTER — Ambulatory Visit: Payer: Medicare Other | Admitting: Vascular Surgery

## 2014-08-01 ENCOUNTER — Other Ambulatory Visit (HOSPITAL_COMMUNITY): Payer: Commercial Managed Care - HMO

## 2014-09-30 IMAGING — CR DG CHEST 1V PORT
1 series · 1 of 1 positions shown · non-contrast
Comparison: Chest radiograph 08/27/2012

CLINICAL DATA: Postop cardiac surgery

PORTABLE CHEST - 1 VIEW

[AP]
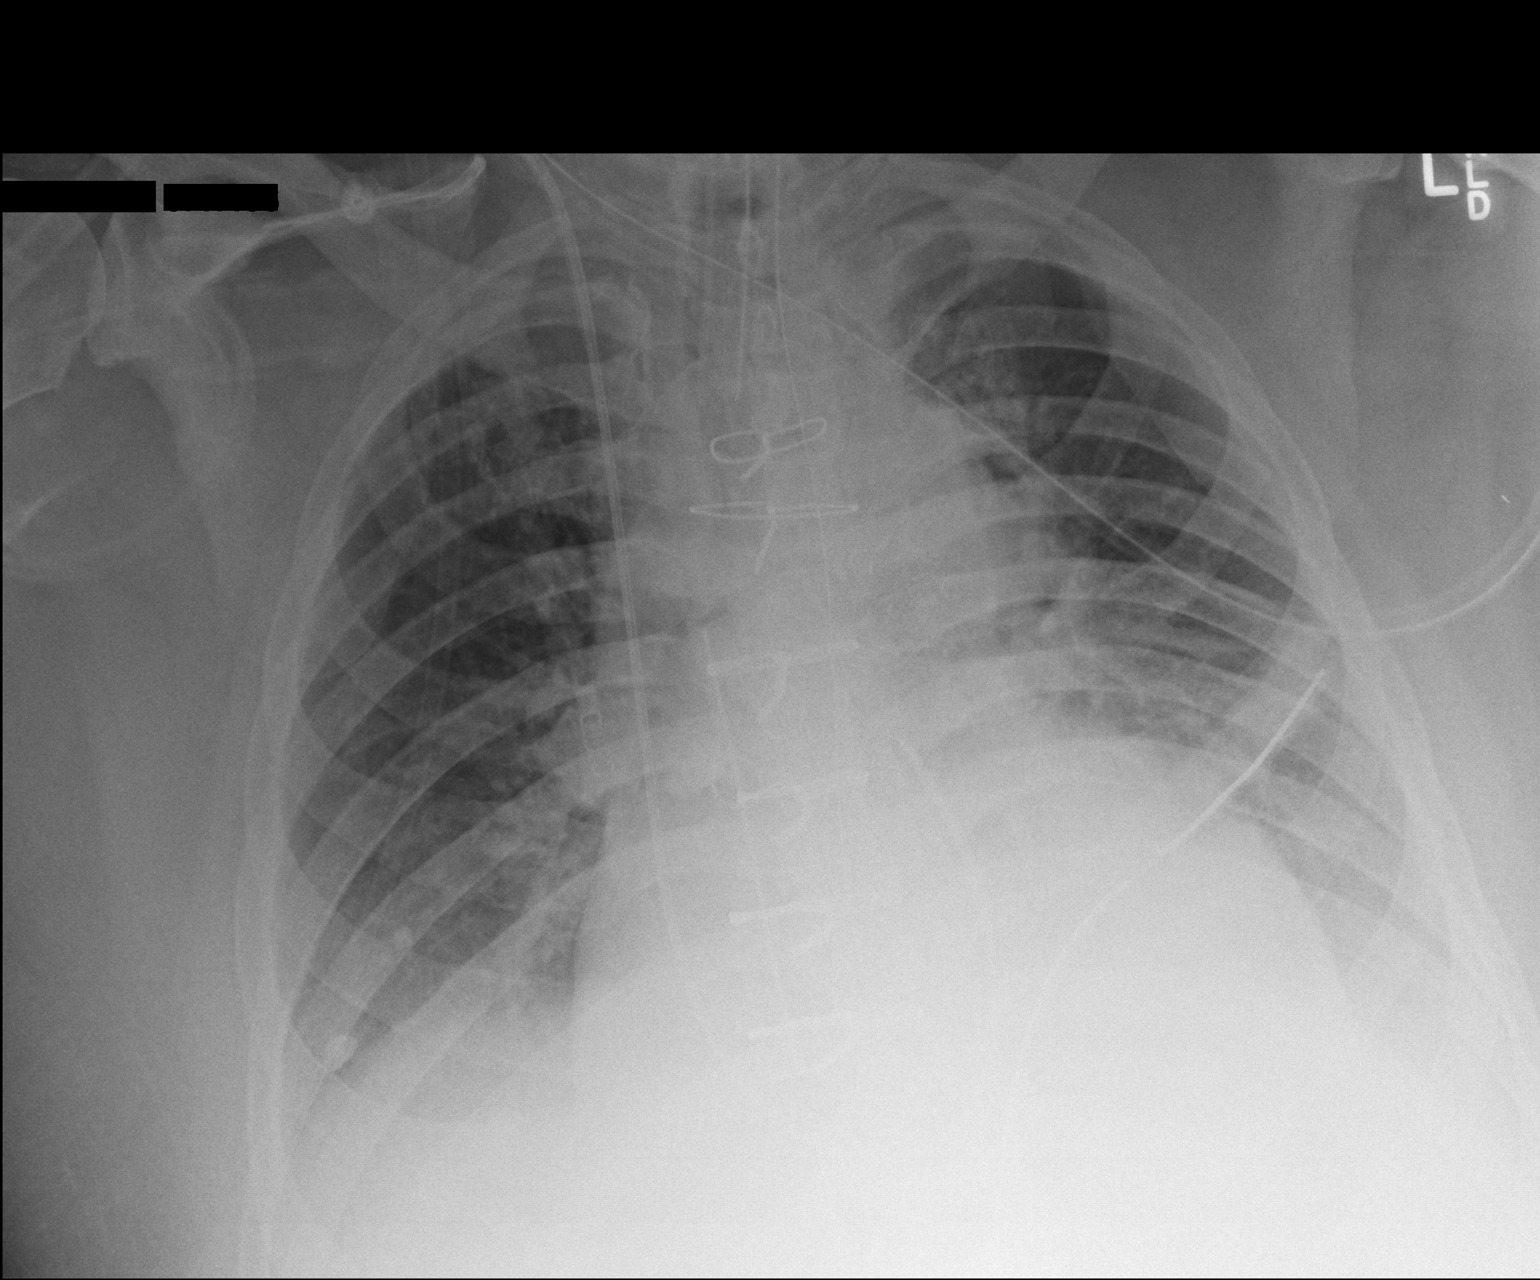

[1 of 1 positions shown; findings below may reference images not displayed]

FINDINGS: Endotracheal tube, Swan-Ganz catheter, mediastinal drains
are unchanged.  NG tube unchanged.  Left chest tube unchanged.
Stable enlarged heart silhouette.  There is central venous
congestion similar to prior.  Bilateral pleural effusions are
increased compared to prior.  No pneumothorax.
IMPRESSION: 1..  Stable support apparatus.
2. Increased bilateral pleural effusions.
3.  No evidence of pulmonary edema.

## 2014-10-01 IMAGING — CR DG CHEST 1V PORT
2 series · 2 of 2 positions shown · non-contrast
Comparison: Chest radiograph 08/28/2012

CLINICAL DATA: Postop cardiac surgery

PORTABLE CHEST - 1 VIEW

[AP (1 of 2)]
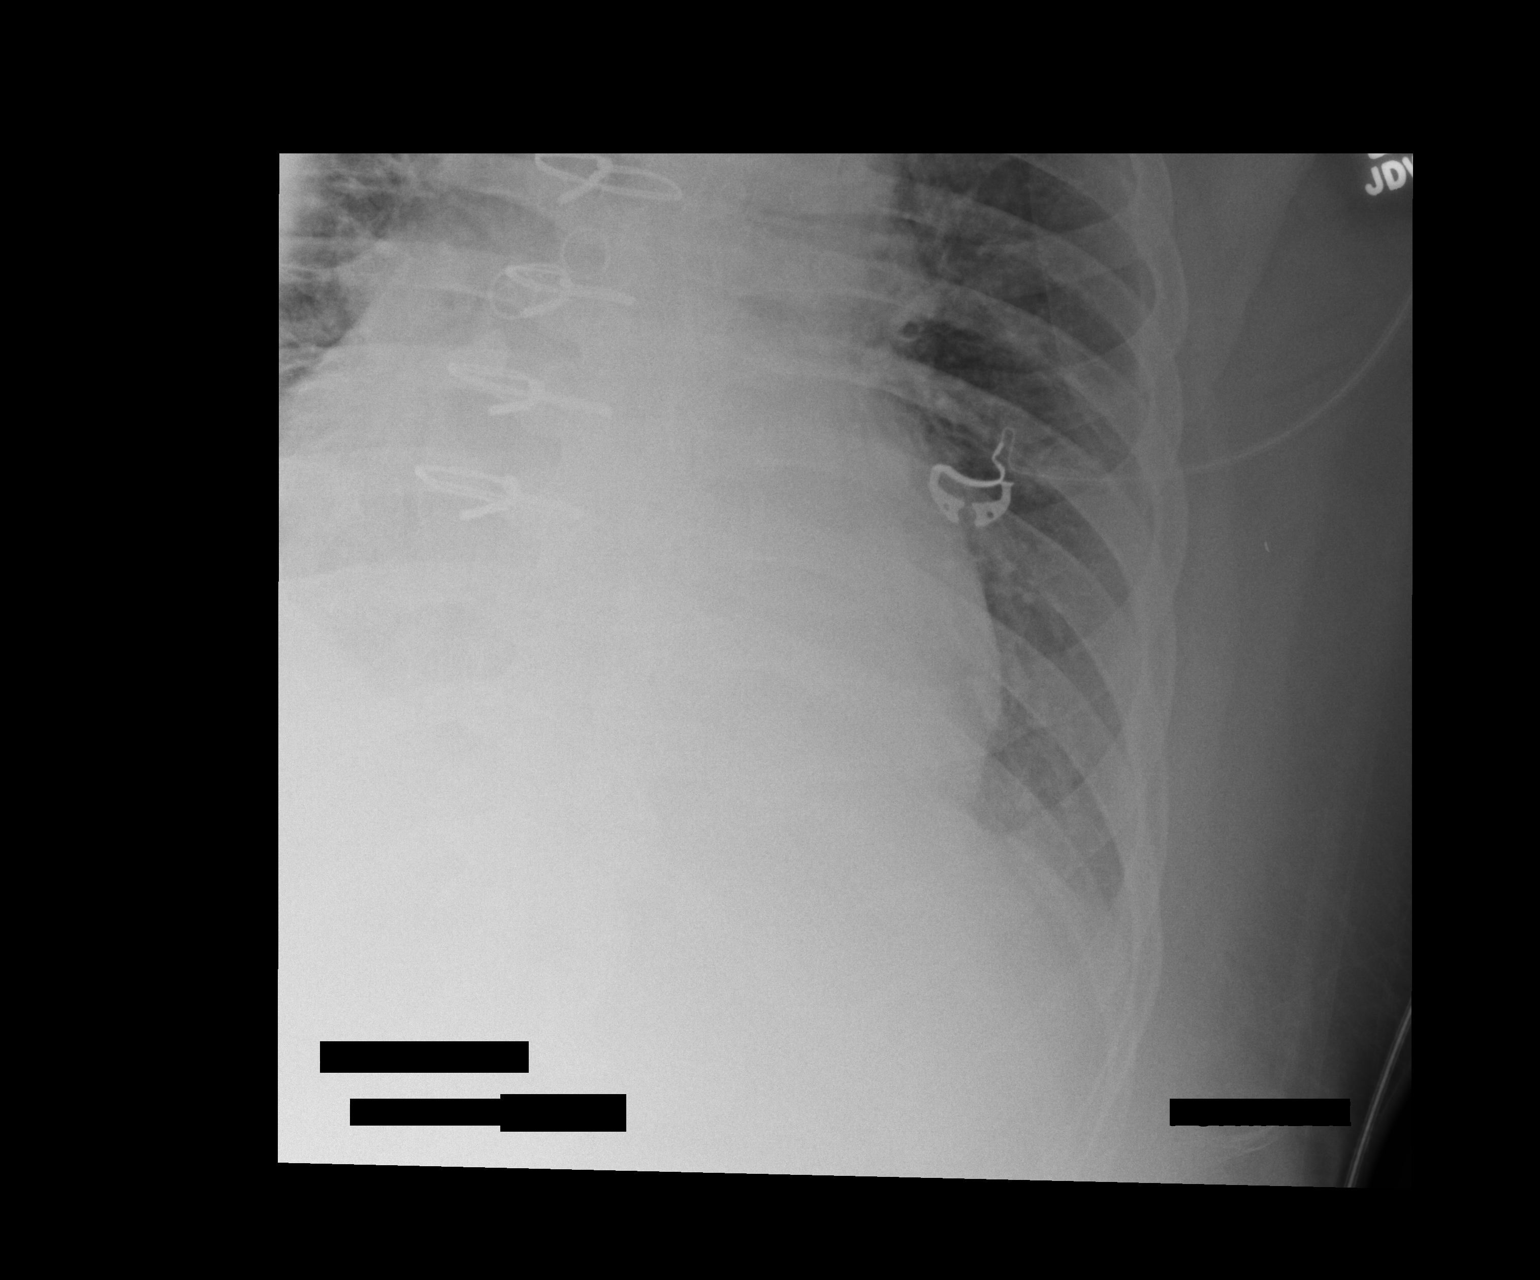

[AP (2 of 2)]
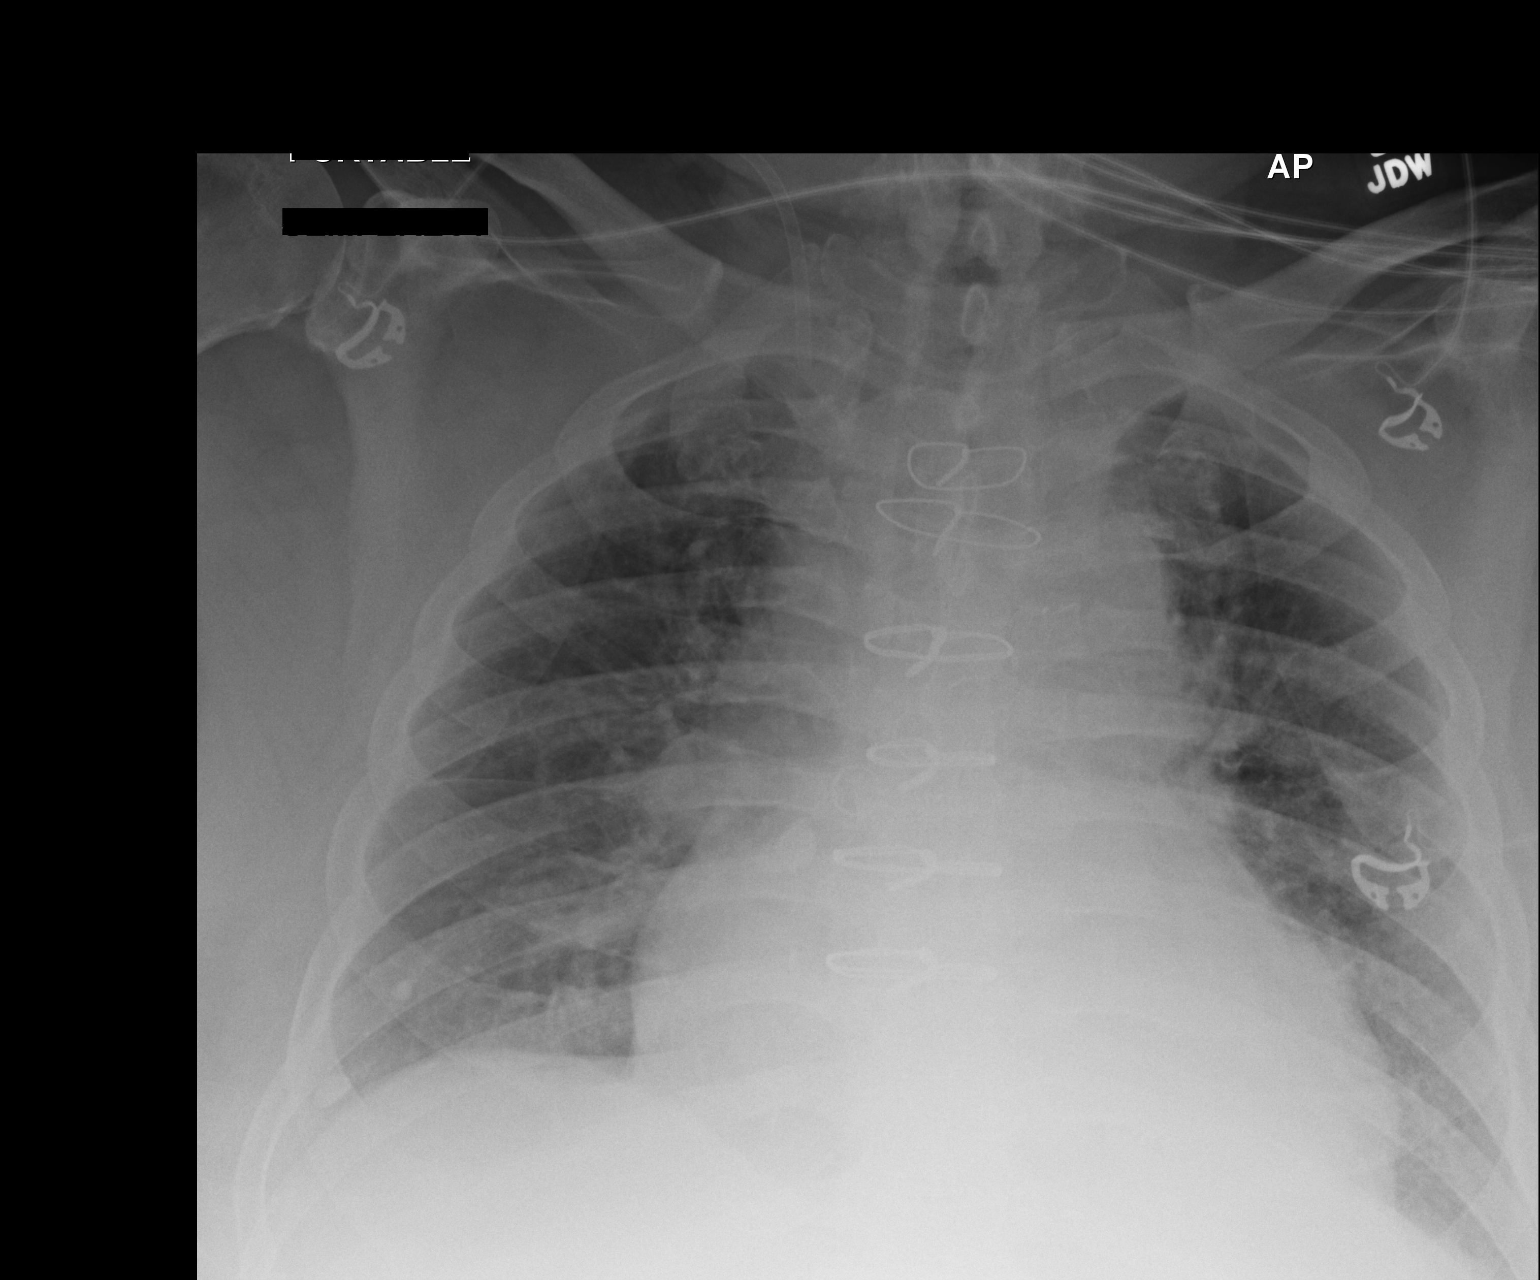

[2 of 2 positions shown; findings below may reference images not displayed]

FINDINGS: Interval extubation with no increase in atelectasis.
Interval removal of left chest tube with no pneumothorax.
Retraction of Swan-Ganz catheter with IJ sheath remaining.
Interval removal of NG tube and mediastinal drain.  There is mild
basilar atelectasis.  There is improvement in bilateral pleural
effusions.
IMPRESSION: 1..  Interval extubation and removal left chest tube without
complication.
2.  Improvement in bilateral pleural effusions.

## 2014-10-02 IMAGING — CR DG CHEST 1V PORT
1 series · 1 of 1 positions shown · non-contrast
Comparison: 08/29/2012; 08/28/2012; 08/27/2012

CLINICAL DATA: Post CABG, evaluate atelectasis

PORTABLE CHEST - 1 VIEW

[AP]
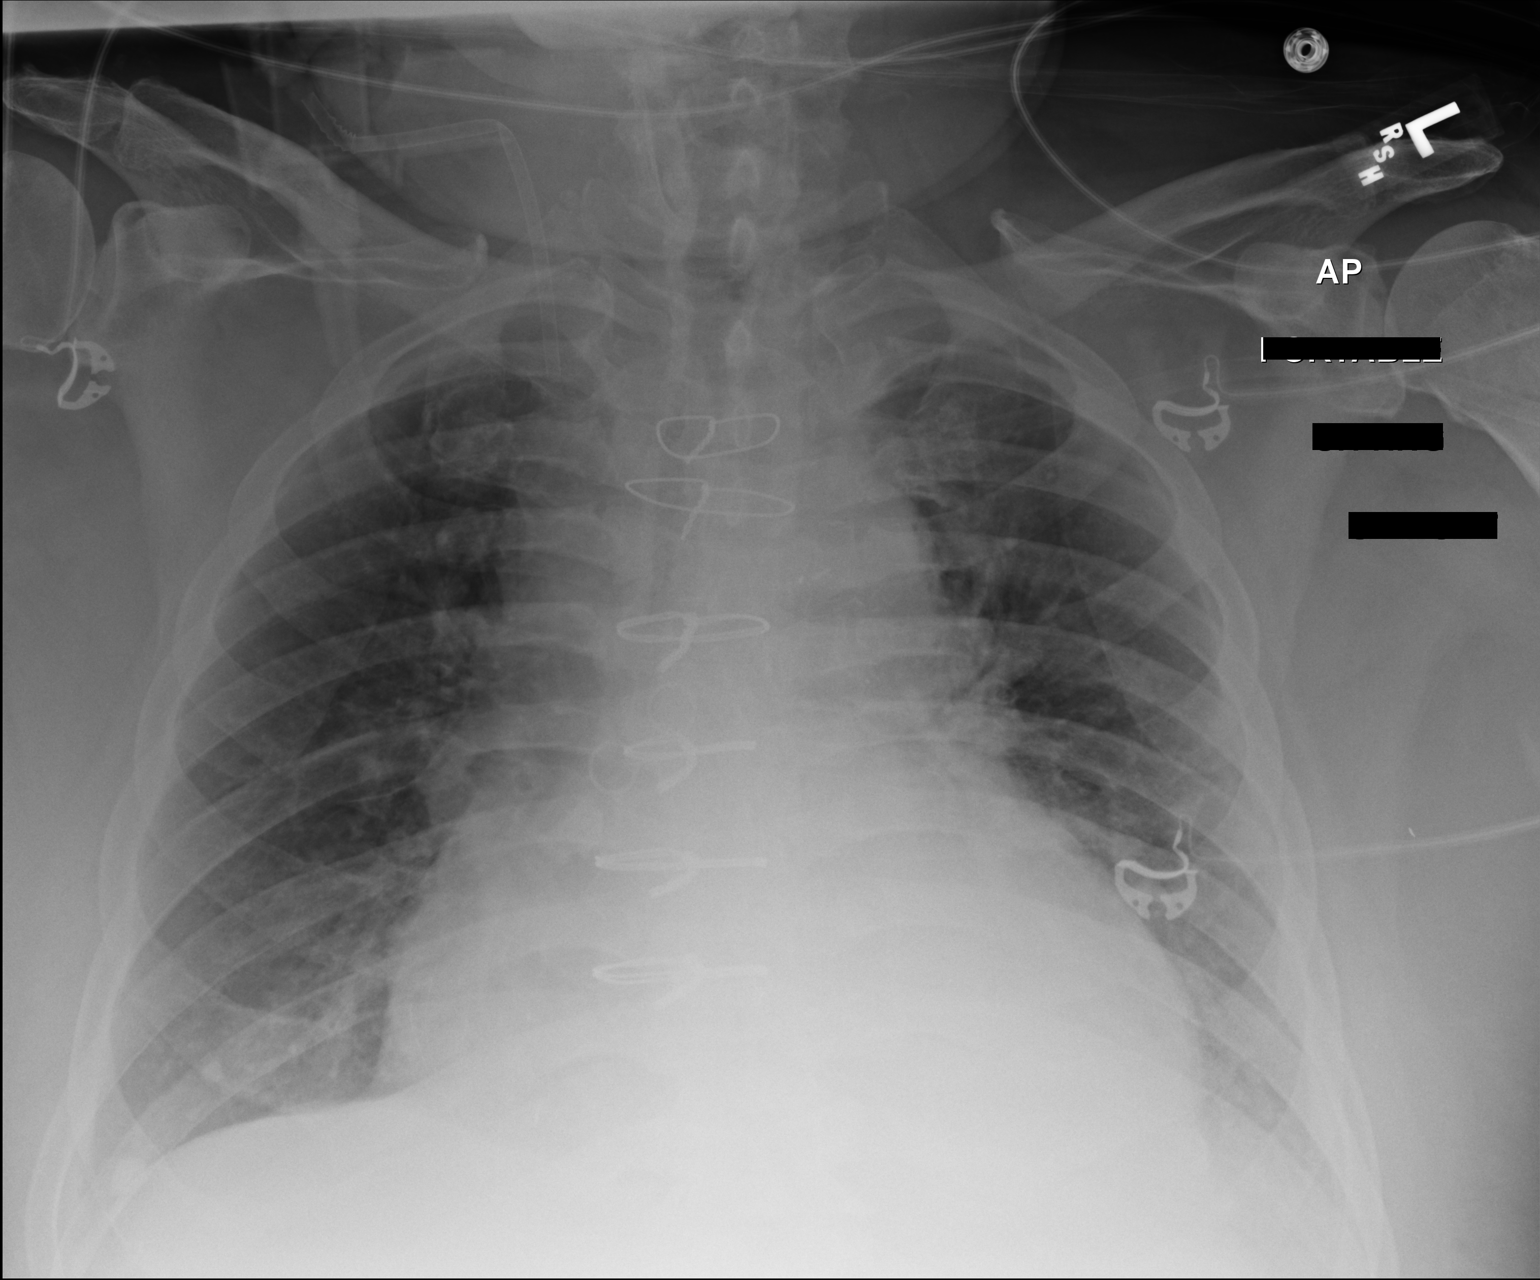

[1 of 1 positions shown; findings below may reference images not displayed]

FINDINGS: Grossly unchanged enlarged cardiac silhouette and mediastinal
contours post median sternotomy and CABG.  Stable positioning of
support apparatus. Minimally improved aeration of the lungs with
persistent pulmonary venous congestion.  Grossly unchanged
perihilar and bibasilar opacities.  No new focal airspace
opacities.  Granulomas overlie the right lower lung.  No definite
pleural effusion or pneumothorax.  Unchanged bones.
IMPRESSION: 1.  Minimally improved pulmonary edema with persistent findings of
pulmonary venous congestion.
2.  Grossly unchanged perihilar and bibasilar atelectasis.

## 2014-10-05 IMAGING — CR DG CHEST 1V PORT
1 series · 1 of 1 positions shown · non-contrast
Comparison: 08/30/2012

CLINICAL DATA: Status post CABG

PORTABLE CHEST - 1 VIEW

[AP]
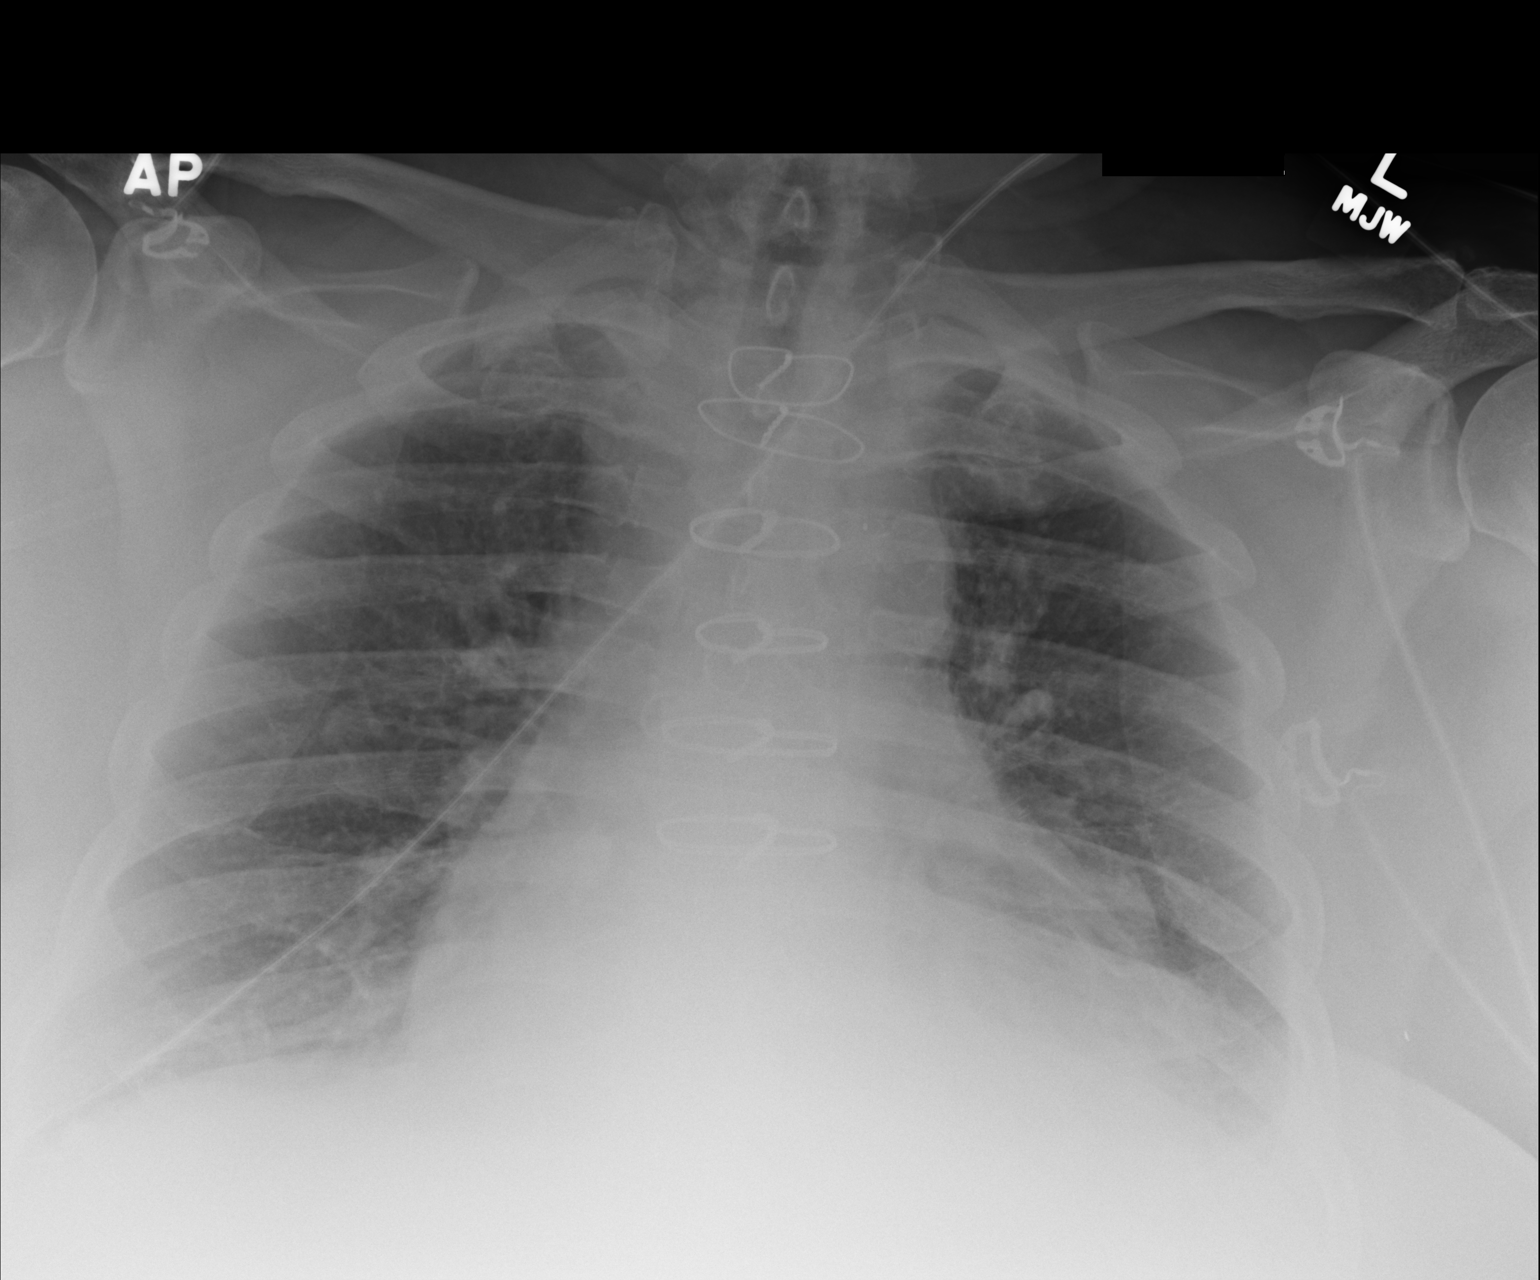

[1 of 1 positions shown; findings below may reference images not displayed]

FINDINGS: Aeration of the left lower lobe is improved with some
residual mild atelectasis remaining.  No edema, pneumothorax or
significant pleural effusion is identified.  The heart size and
mediastinal contours are stable.
IMPRESSION: Improved aeration of the left lower lobe.  No pneumothorax.

## 2014-12-21 ENCOUNTER — Encounter: Payer: Self-pay | Admitting: *Deleted

## 2014-12-28 ENCOUNTER — Ambulatory Visit: Payer: Commercial Managed Care - HMO | Admitting: Cardiovascular Disease

## 2015-08-23 ENCOUNTER — Encounter: Payer: Self-pay | Admitting: Vascular Surgery

## 2015-08-28 ENCOUNTER — Encounter: Payer: Self-pay | Admitting: Vascular Surgery

## 2015-08-28 ENCOUNTER — Ambulatory Visit (INDEPENDENT_AMBULATORY_CARE_PROVIDER_SITE_OTHER): Payer: Commercial Managed Care - HMO | Admitting: Vascular Surgery

## 2015-08-28 VITALS — BP 102/64 | HR 60 | Temp 97.5°F | Resp 20 | Ht 67.0 in | Wt 298.0 lb

## 2015-08-28 DIAGNOSIS — I714 Abdominal aortic aneurysm, without rupture, unspecified: Secondary | ICD-10-CM

## 2015-08-28 DIAGNOSIS — I739 Peripheral vascular disease, unspecified: Secondary | ICD-10-CM | POA: Diagnosis not present

## 2015-08-28 NOTE — Progress Notes (Signed)
HISTORY AND PHYSICAL     CC:  Follow up Referring Provider:  Asencion Noble, MD  HPI: This is a 75 y.o. male who has had a hx of RLE bypass grafting in 2008 and EVAR in 2010, both at [facility].   In 2014, he had a CABG.  He was seen in our office March 2016 by the NP.  He did not have any studies at that time.    He states that he has numbness that comes and goes in both his feet.  He states sometimes he has a hard time standing b/c it feels like he has golf balls on the bottom of his feet.  He is having difficulty walking also.  He states he has diabetic neuropathy.  He does not have any non healing wounds.  He states he also gets some numbness in his hands, but this is due to carpal tunnel syndrome and has to sleep with a brace.  He also has a hard time getting around due to shortness of breath with activity.    He states that he has a skin cancer in his sternotomy incision and is seeing a dermatologist on Thursday for this.  He states that he has scaly skin on his legs and arms.    He is on a beta blocker, CCB, and ARB for BP management.  He is on insulin for diabetes.  He takes a daily aspirin.  Past Medical History  Diagnosis Date  . HTN (hypertension)   . Gout   . Diabetes mellitus   . Prostate cancer ([place])   . Urinary incontinence due to urethral sphincter incompetence   . AAA (abdominal aortic aneurysm) ([place])   . Hyperlipidemia   . CAD (coronary artery disease)     multiple stents to the circ and right coronary artery.  CABG in 08/2012 for severe three-vessel coronary artery disease    Past Surgical History  Procedure Laterality Date  . Coronary stent placement       He had 2.5 x 20-mm Taxus stent placed in the  his circ in August 2007   He had 2.5 x 20-mm Taxus stent placed in the   mid circ and 2.5 x 16-mm stent placed in the distal circ.  [name] Nose surgery    . Prostate surgery  1998    has urinary prosthesis for incontinence  . Appendectomy    . Cholecystectomy    . Ankle  fracture surgery    . Above -knee to below knee popliteal bypass  novemeber 2010  . Knee surgery  10-2010  . Back surgery    . Coronary artery bypass graft N/A 08/27/2012    Procedure: CORONARY ARTERY BYPASS GRAFTING times four using Left Greater Saphenous Vein Graft harvested endoscopically and Left Internal Mammary Artery;  Surgeon: Melrose Nakayama, MD;  Location: [facility];  Service: Open Heart Surgery;  Laterality: N/A;  . Tee without cardioversion N/A 08/27/2012    Procedure: TRANSESOPHAGEAL ECHOCARDIOGRAM (TEE);  Surgeon: Melrose Nakayama, MD;  Location: [facility];  Service: Open Heart Surgery;  Laterality: N/A;  . Abdominal aortic aneurysm repair  2008    [facility]  . Cardiac catheterization  08/25/2012  . Cardiac catheterization      [place], [place]  . Coronary angioplasty  2006 & 2007    [facility] stent x 2   . Coronary angioplasty with stent placement  2003    [place], [place] stent x 2  . Left heart catheterization with coronary angiogram N/A 08/25/2012  Procedure: LEFT HEART CATHETERIZATION WITH CORONARY ANGIOGRAM;  Surgeon: Wellington Hampshire, MD;  Location: [facility] CATH LAB;  Service: Cardiovascular;  Laterality: N/A;    Allergies  Allergen Reactions  . Lipitor [Atorvastatin]   . Niaspan [Niacin]   . Statins     Myalgias  . Tolectin [Tolmetin Sodium]   . Zocor [Simvastatin]     Current Outpatient Prescriptions  Medication Sig Dispense Refill  . allopurinol (ZYLOPRIM) 300 MG tablet Take 300 mg by mouth daily.      [name] amLODipine (NORVASC) 5 MG tablet Take 5 mg by mouth daily.    [name] aspirin EC 325 MG EC tablet Take 1 tablet (325 mg total) by mouth daily. 30 tablet 0  . atenolol (TENORMIN) 50 MG tablet Take 50 mg by mouth daily.    . clonazePAM (KLONOPIN) 0.5 MG tablet Take 0.5 mg by mouth 2 (two) times daily as needed for anxiety.     . fenofibrate 160 MG tablet Take 1 tablet (160 mg total) by mouth daily. 30 tablet 1  . fesoterodine (TOVIAZ) 8 MG TB24 tablet Take 8 mg by mouth daily.    [name]  HYDROcodone-acetaminophen (NORCO) 10-325 MG per tablet Take 1 tablet by mouth every 6 (six) hours as needed for moderate pain.    [name] insulin NPH-regular Human (NOVOLIN 70/30) (70-30) 100 UNIT/ML injection Inject 100 Units into the skin 2 (two) times daily with a meal. And as glucose levels increase, increase insulin as was on 110 units injected twice daily before surgery.    [name] losartan (COZAAR) 100 MG tablet Take 100 mg by mouth daily.     [name] PARoxetine (PAXIL) 40 MG tablet Take 40 mg by mouth every morning.      . potassium chloride (K-DUR) 10 MEQ tablet Take 1 tablet (10 mEq total) by mouth 2 (two) times daily. 30 tablet 0  . furosemide (LASIX) 40 MG tablet Take 1 tablet (40 mg total) by mouth daily. 30 tablet 11   No current facility-administered medications for this visit.    Family History  Problem Relation Age of Onset  . Heart attack Mother   . Heart disease Mother   . Hypertension Mother   . Hyperlipidemia Mother   . Heart attack Father 6  . Heart attack Sister 85    MI  . Heart disease Sister     Social History   Social History  . Marital Status: Married    Spouse Name: N/A  . Number of Children: N/A  . Years of Education: N/A   Occupational History  . Not on file.   Social History Main Topics  . Smoking status: Former Smoker -- 2.50 packs/day for 0 years    Types: Cigarettes    Quit date: 02/11/1987  . Smokeless tobacco: Never Used  . Alcohol Use: No  . Drug Use: No  . Sexual Activity: Not on file   Other Topics Concern  . Not on file   Social History Narrative   The patient is retired.  He lives in the [place] area.        He enjoys hunting and 4-wheeling.     REVIEW OF SYSTEMS:   [X]  denotes positive finding, [ ]  denotes negative finding Cardiac  Comments:  Chest pain or chest pressure:    Shortness of breath upon exertion: x   Short of breath when lying flat:    Irregular heart rhythm:        Vascular    Pain in calf,  thigh, or hip brought on  by ambulation: x   Pain in feet at night that wakes you up from your sleep:  x   Blood clot in your veins:    Leg swelling:  x       Pulmonary    Oxygen at home:    Productive cough:  x   Wheezing:         Neurologic    Sudden weakness in arms or legs:  x   Sudden numbness in arms or legs:     Sudden onset of difficulty speaking or slurred speech:    Temporary loss of vision in one eye:     Problems with dizziness:         Gastrointestinal    Blood in stool:     Vomited blood:         Genitourinary    Burning when urinating:     Blood in urine:    Urinary incontinence  x   Psychiatric    Major depression:         Hematologic    Bleeding problems:    Problems with blood clotting too easily:        Skin    Rashes or ulcers:        Constitutional    Fever or chills:      PHYSICAL EXAMINATION:  Filed Vitals:   08/28/15 1348  BP: 102/64  Pulse: 60  Temp: 97.5 F (36.4 C)  Resp: 20   Body mass index is 46.66 kg/(m^2).  General:  WDWN in NAD; vital signs documented above Gait: Not observed HENT: WNL, normocephalic Pulmonary: normal non-labored breathing , without Rales, rhonchi,  wheezing Cardiac: regular HR, without  Murmurs, rubs or gallops; without carotid bruits Abdomen: soft, NT, no masses Skin: without rashes Vascular Exam/Pulses:  Right Left  Radial 2+ (normal) 2+ (normal)  Ulnar 2+ (normal) Unable to palpate   Popliteal 2+ (normal) Unable to palpate   DP Unable to palpate  Unable to palpate   PT biphasic biphasic   Extremities: without ischemic changes, without Gangrene , without cellulitis; without open wounds;  Musculoskeletal: no muscle wasting or atrophy  Neurologic: A&O X 3;  No focal weakness or paresthesias are detected Psychiatric:  The pt has Normal affect.   Non-Invasive Vascular Imaging:   none  Pt meds includes: Statin:  Yes.   Beta Blocker:  Yes.   Aspirin:  Yes.   ACEI:  No. ARB:  Yes.   Other  Antiplatelet/Anticoagulant:  No.    ASSESSMENT/PLAN:: 75 y.o. male with hx of right femoral to popliteal bypass grafting in 2008 ([facility]) and EVAR in 2010 ([facility]) who is following up today    -pt does have biphasic doppler signals in both feet.  He has not had any ABI's since 2014.  He does not have any non healing wounds on his feet.  His numbness & sensation of golf balls on the bottom of his feet is most likely due to diabetic neuropathy.   -as far as his follow up for AAA repair (EVAR) , he has not had a CTA since 2014.  He really does not want to have this done and felt like if there was something wrong, he would know about it.   -Dr. Donnetta Hutching had a long discussion with him him about follow up.  Dr. Donnetta Hutching was able to palpate a right popliteal pulse indicating his bypass is patent.  He does have biphasic signals in the PT  bilaterally.   -in the note from Dr. Willey Blade, it indicates the pt has CKD 3 and to evaluate the AAA repair, this would be the best method, however, his renal insufficiency makes this difficult.  Given his obesity, a duplex scan of the repair would be difficult to visualize.  -the pt does not want to have any testing for this, therefore, the pt will present back to [place] if he has any issues in the future and will continue to f/u with Dr. Willey Blade and Dr. [name].    Leontine Locket, PA-C [facility] [phone]  Clinic MD:  Pt seen and examined in conjunction with Dr. Donnetta Hutching  I have examined the patient, reviewed and agree with above. Patient is not interested in follow-up for evaluation of his prior aortic stent graft placed at [facility]. Reports that he does not feel anything would be able to do be done even if there were trouble. Also was not interested in any ongoing follow-up of his lower from the arterial insufficiency. Encouraged him to notify us should she develop any rest pain or tissue loss. Otherwise she will see [place] on as-needed basis  Curt Jews,  MD 08/28/2015 3:12 PM

## 2016-03-26 DIAGNOSIS — I2510 Atherosclerotic heart disease of native coronary artery without angina pectoris: Secondary | ICD-10-CM | POA: Diagnosis not present

## 2016-03-26 DIAGNOSIS — E785 Hyperlipidemia, unspecified: Secondary | ICD-10-CM | POA: Diagnosis not present

## 2016-03-26 DIAGNOSIS — E119 Type 2 diabetes mellitus without complications: Secondary | ICD-10-CM | POA: Diagnosis not present

## 2016-03-26 DIAGNOSIS — I10 Essential (primary) hypertension: Secondary | ICD-10-CM | POA: Diagnosis not present

## 2016-03-26 DIAGNOSIS — Z79899 Other long term (current) drug therapy: Secondary | ICD-10-CM | POA: Diagnosis not present

## 2016-04-03 DIAGNOSIS — M545 Low back pain: Secondary | ICD-10-CM | POA: Diagnosis not present

## 2016-04-03 DIAGNOSIS — E1122 Type 2 diabetes mellitus with diabetic chronic kidney disease: Secondary | ICD-10-CM | POA: Diagnosis not present

## 2016-04-03 DIAGNOSIS — N183 Chronic kidney disease, stage 3 (moderate): Secondary | ICD-10-CM | POA: Diagnosis not present

## 2016-07-30 DIAGNOSIS — Z79899 Other long term (current) drug therapy: Secondary | ICD-10-CM | POA: Diagnosis not present

## 2016-07-30 DIAGNOSIS — E785 Hyperlipidemia, unspecified: Secondary | ICD-10-CM | POA: Diagnosis not present

## 2016-07-30 DIAGNOSIS — E119 Type 2 diabetes mellitus without complications: Secondary | ICD-10-CM | POA: Diagnosis not present

## 2016-07-30 DIAGNOSIS — N183 Chronic kidney disease, stage 3 (moderate): Secondary | ICD-10-CM | POA: Diagnosis not present

## 2016-08-12 DIAGNOSIS — E785 Hyperlipidemia, unspecified: Secondary | ICD-10-CM | POA: Diagnosis not present

## 2016-08-12 DIAGNOSIS — N183 Chronic kidney disease, stage 3 (moderate): Secondary | ICD-10-CM | POA: Diagnosis not present

## 2016-08-12 DIAGNOSIS — E1122 Type 2 diabetes mellitus with diabetic chronic kidney disease: Secondary | ICD-10-CM | POA: Diagnosis not present

## 2016-08-22 DIAGNOSIS — H25811 Combined forms of age-related cataract, right eye: Secondary | ICD-10-CM | POA: Diagnosis not present

## 2016-08-22 DIAGNOSIS — H5203 Hypermetropia, bilateral: Secondary | ICD-10-CM | POA: Diagnosis not present

## 2016-08-22 DIAGNOSIS — H52223 Regular astigmatism, bilateral: Secondary | ICD-10-CM | POA: Diagnosis not present

## 2016-08-22 DIAGNOSIS — H524 Presbyopia: Secondary | ICD-10-CM | POA: Diagnosis not present

## 2016-11-17 DIAGNOSIS — M109 Gout, unspecified: Secondary | ICD-10-CM | POA: Diagnosis not present

## 2016-11-17 DIAGNOSIS — E1129 Type 2 diabetes mellitus with other diabetic kidney complication: Secondary | ICD-10-CM | POA: Diagnosis not present

## 2016-11-17 DIAGNOSIS — E785 Hyperlipidemia, unspecified: Secondary | ICD-10-CM | POA: Diagnosis not present

## 2016-11-17 DIAGNOSIS — Z79899 Other long term (current) drug therapy: Secondary | ICD-10-CM | POA: Diagnosis not present

## 2016-12-11 DIAGNOSIS — Z23 Encounter for immunization: Secondary | ICD-10-CM | POA: Diagnosis not present

## 2016-12-11 DIAGNOSIS — M109 Gout, unspecified: Secondary | ICD-10-CM | POA: Diagnosis not present

## 2016-12-11 DIAGNOSIS — E1122 Type 2 diabetes mellitus with diabetic chronic kidney disease: Secondary | ICD-10-CM | POA: Diagnosis not present

## 2016-12-11 DIAGNOSIS — N183 Chronic kidney disease, stage 3 (moderate): Secondary | ICD-10-CM | POA: Diagnosis not present

## 2017-01-05 DIAGNOSIS — C44519 Basal cell carcinoma of skin of other part of trunk: Secondary | ICD-10-CM | POA: Diagnosis not present

## 2017-01-05 DIAGNOSIS — L570 Actinic keratosis: Secondary | ICD-10-CM | POA: Diagnosis not present

## 2017-01-05 DIAGNOSIS — L820 Inflamed seborrheic keratosis: Secondary | ICD-10-CM | POA: Diagnosis not present

## 2017-01-05 DIAGNOSIS — X32XXXD Exposure to sunlight, subsequent encounter: Secondary | ICD-10-CM | POA: Diagnosis not present

## 2017-01-05 DIAGNOSIS — Z1283 Encounter for screening for malignant neoplasm of skin: Secondary | ICD-10-CM | POA: Diagnosis not present

## 2017-03-19 DIAGNOSIS — Z79899 Other long term (current) drug therapy: Secondary | ICD-10-CM | POA: Diagnosis not present

## 2017-03-19 DIAGNOSIS — I10 Essential (primary) hypertension: Secondary | ICD-10-CM | POA: Diagnosis not present

## 2017-03-19 DIAGNOSIS — E119 Type 2 diabetes mellitus without complications: Secondary | ICD-10-CM | POA: Diagnosis not present

## 2017-03-19 DIAGNOSIS — I2510 Atherosclerotic heart disease of native coronary artery without angina pectoris: Secondary | ICD-10-CM | POA: Diagnosis not present

## 2017-03-19 DIAGNOSIS — E785 Hyperlipidemia, unspecified: Secondary | ICD-10-CM | POA: Diagnosis not present

## 2017-03-26 DIAGNOSIS — E1122 Type 2 diabetes mellitus with diabetic chronic kidney disease: Secondary | ICD-10-CM | POA: Diagnosis not present

## 2017-03-26 DIAGNOSIS — N183 Chronic kidney disease, stage 3 (moderate): Secondary | ICD-10-CM | POA: Diagnosis not present

## 2017-03-26 DIAGNOSIS — I714 Abdominal aortic aneurysm, without rupture: Secondary | ICD-10-CM | POA: Diagnosis not present

## 2017-05-26 DIAGNOSIS — Z08 Encounter for follow-up examination after completed treatment for malignant neoplasm: Secondary | ICD-10-CM | POA: Diagnosis not present

## 2017-05-26 DIAGNOSIS — I872 Venous insufficiency (chronic) (peripheral): Secondary | ICD-10-CM | POA: Diagnosis not present

## 2017-05-26 DIAGNOSIS — X32XXXD Exposure to sunlight, subsequent encounter: Secondary | ICD-10-CM | POA: Diagnosis not present

## 2017-05-26 DIAGNOSIS — Z85828 Personal history of other malignant neoplasm of skin: Secondary | ICD-10-CM | POA: Diagnosis not present

## 2017-05-26 DIAGNOSIS — L570 Actinic keratosis: Secondary | ICD-10-CM | POA: Diagnosis not present

## 2017-05-26 DIAGNOSIS — L918 Other hypertrophic disorders of the skin: Secondary | ICD-10-CM | POA: Diagnosis not present

## 2017-05-26 DIAGNOSIS — D485 Neoplasm of uncertain behavior of skin: Secondary | ICD-10-CM | POA: Diagnosis not present

## 2017-06-18 DIAGNOSIS — E1129 Type 2 diabetes mellitus with other diabetic kidney complication: Secondary | ICD-10-CM | POA: Diagnosis not present

## 2017-06-18 DIAGNOSIS — N183 Chronic kidney disease, stage 3 (moderate): Secondary | ICD-10-CM | POA: Diagnosis not present

## 2017-06-18 DIAGNOSIS — Z79899 Other long term (current) drug therapy: Secondary | ICD-10-CM | POA: Diagnosis not present

## 2017-06-25 DIAGNOSIS — E1122 Type 2 diabetes mellitus with diabetic chronic kidney disease: Secondary | ICD-10-CM | POA: Diagnosis not present

## 2017-06-25 DIAGNOSIS — I714 Abdominal aortic aneurysm, without rupture: Secondary | ICD-10-CM | POA: Diagnosis not present

## 2017-06-25 DIAGNOSIS — N183 Chronic kidney disease, stage 3 (moderate): Secondary | ICD-10-CM | POA: Diagnosis not present

## 2017-07-13 ENCOUNTER — Other Ambulatory Visit: Payer: Self-pay

## 2017-07-13 DIAGNOSIS — I714 Abdominal aortic aneurysm, without rupture, unspecified: Secondary | ICD-10-CM

## 2017-08-18 ENCOUNTER — Ambulatory Visit: Payer: Medicare Other | Admitting: Vascular Surgery

## 2017-08-18 ENCOUNTER — Encounter: Payer: Self-pay | Admitting: Vascular Surgery

## 2017-08-18 ENCOUNTER — Ambulatory Visit (HOSPITAL_COMMUNITY)
Admission: RE | Admit: 2017-08-18 | Discharge: 2017-08-18 | Disposition: A | Discharge status: Home / Self Care | Payer: Medicare Other | Source: Ambulatory Visit | Attending: Vascular Surgery | Admitting: Vascular Surgery

## 2017-08-18 ENCOUNTER — Other Ambulatory Visit: Payer: Self-pay

## 2017-08-18 VITALS — BP 128/74 | HR 58 | Temp 98.0°F | Resp 20 | Ht 67.0 in | Wt 292.0 lb

## 2017-08-18 DIAGNOSIS — I714 Abdominal aortic aneurysm, without rupture, unspecified: Secondary | ICD-10-CM

## 2017-08-18 DIAGNOSIS — I739 Peripheral vascular disease, unspecified: Secondary | ICD-10-CM | POA: Diagnosis not present

## 2017-08-18 NOTE — Progress Notes (Signed)
[facility] [place]  Patient name: Max Anderson MRN: [id number] DOB: 1940-06-07 Sex: male  REASON FOR VISIT: Follow-up stent graft repair abdominal aortic aneurysm 2008 and right femoral to popliteal bypass in 2010 2008.  Both these procedures done at [facility]  HPI: Max Anderson is a 77 y.o. male here for follow-up.  He is here today with his wife.  He continues to have limitation mainly related to severe COPD and knee difficulty.  He has morbid obesity and has difficult time and walking.  He has no symptoms referable to peripheral vascular occlusive disease or aneurysm  Past Medical History:  Diagnosis Date  . AAA (abdominal aortic aneurysm) ([place])   . CAD (coronary artery disease)    multiple stents to the circ and right coronary artery.  CABG in 08/2012 for severe three-vessel coronary artery disease  . Diabetes mellitus   . Gout   . HTN (hypertension)   . Hyperlipidemia   . Prostate cancer ([place])   . Urinary incontinence due to urethral sphincter incompetence     Family History  Problem Relation Age of Onset  . Heart attack Mother   . Heart disease Mother   . Hypertension Mother   . Hyperlipidemia Mother   . Heart attack Father 25  . Heart attack Sister 5       MI  . Heart disease Sister     SOCIAL HISTORY: Social History   Tobacco Use  . Smoking status: Former Smoker    Packs/day: 2.50    Years: 0.00    Pack years: 0.00    Types: Cigarettes    Last attempt to quit: 02/11/1987    Years since quitting: 30.5  . Smokeless tobacco: Never Used  Substance Use Topics  . Alcohol use: No    Alcohol/week: 0.0 oz    Allergies  Allergen Reactions  . Lipitor [Atorvastatin]   . Niaspan [Niacin]   . Statins     Myalgias  . Tolectin [Tolmetin Sodium]   . Zocor [Simvastatin]     Current Outpatient Medications  Medication Sig Dispense Refill  . allopurinol (ZYLOPRIM) 300 MG tablet Take 300 mg by mouth  daily.      [name] amLODipine (NORVASC) 5 MG tablet Take 5 mg by mouth daily.    [name] aspirin EC 325 MG EC tablet Take 1 tablet (325 mg total) by mouth daily. 30 tablet 0  . atenolol (TENORMIN) 50 MG tablet Take 50 mg by mouth daily.    . clonazePAM (KLONOPIN) 0.5 MG tablet Take 0.5 mg by mouth 2 (two) times daily as needed for anxiety.     . fenofibrate 160 MG tablet Take 1 tablet (160 mg total) by mouth daily. 30 tablet 1  . fesoterodine (TOVIAZ) 8 MG TB24 tablet Take 8 mg by mouth daily.    [name] HYDROcodone-acetaminophen (NORCO) 10-325 MG per tablet Take 1 tablet by mouth every 6 (six) hours as needed for moderate pain.    [name] insulin NPH-regular Human (NOVOLIN 70/30) (70-30) 100 UNIT/ML injection Inject 100 Units into the skin 2 (two) times daily with a meal. And as glucose levels increase, increase insulin as was on 110 units injected twice daily before surgery.    [name] losartan (COZAAR) 100 MG tablet Take 100 mg by mouth daily.     [name] PARoxetine (PAXIL) 40 MG tablet Take 40 mg by mouth every morning.      . potassium chloride (K-DUR) 10 MEQ tablet Take 1 tablet (  10 mEq total) by mouth 2 (two) times daily. 30 tablet 0  . furosemide (LASIX) 40 MG tablet Take 1 tablet (40 mg total) by mouth daily. 30 tablet 11   No current facility-administered medications for this visit.     REVIEW OF SYSTEMS:  [X]  denotes positive finding, [ ]  denotes negative finding Cardiac  Comments:  Chest pain or chest pressure:    Shortness of breath upon exertion: x   Short of breath when lying flat: x   Irregular heart rhythm:        Vascular    Pain in calf, thigh, or hip brought on by ambulation:    Pain in feet at night that wakes you up from your sleep:     Blood clot in your veins:    Leg swelling:  x         PHYSICAL EXAM: Vitals:   08/18/17 0932  BP: 128/74  Pulse: (!) 58  Resp: 20  Temp: 98 F (36.7 C)  TempSrc: Oral  SpO2: 97%  Weight: 292 lb (132.5 kg)  Height: 5\' 7"  (1.702 m)    GENERAL: The  patient is a well-nourished male, in no acute distress. The vital signs are documented above. CARDIOVASCULAR: Carotid arteries without bruits.  2+ radial pulses bilaterally.  I do not palpate distal pulses. Morbidly obese.  Do not palpate an aneurysm PULMONARY: There is good air exchange  MUSCULOSKELETAL: There are no major deformities or cyanosis. NEUROLOGIC: No focal weakness or paresthesias are detected. SKIN: There are no ulcers or rashes noted. PSYCHIATRIC: The patient has a normal affect.  DATA:  Duplex today had some technical limitations due to his size.  Appears that his aneurysm maximal diameter is 5.5 cm which is unchanged  MEDICAL ISSUES: Discussed this with the patient and his wife present.  At our last visit 2 years ago he stated that he did not want to have any ongoing follow-up.  I have suggested that we see him in 3 years for a duplex just to assure that he is not having any enlargement of his aneurysm.  He will see [place] again at that time    Rosetta Posner, MD [facility] Tel [phone] Pager [phone]

## 2017-11-24 DIAGNOSIS — N183 Chronic kidney disease, stage 3 (moderate): Secondary | ICD-10-CM | POA: Diagnosis not present

## 2017-11-24 DIAGNOSIS — I714 Abdominal aortic aneurysm, without rupture: Secondary | ICD-10-CM | POA: Diagnosis not present

## 2017-11-24 DIAGNOSIS — E1129 Type 2 diabetes mellitus with other diabetic kidney complication: Secondary | ICD-10-CM | POA: Diagnosis not present

## 2017-11-24 DIAGNOSIS — Z79899 Other long term (current) drug therapy: Secondary | ICD-10-CM | POA: Diagnosis not present

## 2017-11-27 DIAGNOSIS — I714 Abdominal aortic aneurysm, without rupture: Secondary | ICD-10-CM | POA: Diagnosis not present

## 2017-11-27 DIAGNOSIS — E1129 Type 2 diabetes mellitus with other diabetic kidney complication: Secondary | ICD-10-CM | POA: Diagnosis not present

## 2017-11-27 DIAGNOSIS — Z23 Encounter for immunization: Secondary | ICD-10-CM | POA: Diagnosis not present

## 2017-11-27 DIAGNOSIS — E785 Hyperlipidemia, unspecified: Secondary | ICD-10-CM | POA: Diagnosis not present

## 2018-03-01 DIAGNOSIS — E785 Hyperlipidemia, unspecified: Secondary | ICD-10-CM | POA: Diagnosis not present

## 2018-03-01 DIAGNOSIS — E1129 Type 2 diabetes mellitus with other diabetic kidney complication: Secondary | ICD-10-CM | POA: Diagnosis not present

## 2018-03-01 DIAGNOSIS — I2510 Atherosclerotic heart disease of native coronary artery without angina pectoris: Secondary | ICD-10-CM | POA: Diagnosis not present

## 2018-03-03 DIAGNOSIS — N183 Chronic kidney disease, stage 3 (moderate): Secondary | ICD-10-CM | POA: Diagnosis not present

## 2018-03-03 DIAGNOSIS — E1122 Type 2 diabetes mellitus with diabetic chronic kidney disease: Secondary | ICD-10-CM | POA: Diagnosis not present

## 2018-03-03 DIAGNOSIS — I714 Abdominal aortic aneurysm, without rupture: Secondary | ICD-10-CM | POA: Diagnosis not present

## 2018-07-24 DIAGNOSIS — Z9861 Coronary angioplasty status: Secondary | ICD-10-CM | POA: Diagnosis not present

## 2018-07-24 DIAGNOSIS — I2510 Atherosclerotic heart disease of native coronary artery without angina pectoris: Secondary | ICD-10-CM | POA: Diagnosis not present

## 2018-07-24 DIAGNOSIS — N179 Acute kidney failure, unspecified: Secondary | ICD-10-CM | POA: Diagnosis not present

## 2018-07-24 DIAGNOSIS — Z794 Long term (current) use of insulin: Secondary | ICD-10-CM | POA: Diagnosis not present

## 2018-07-24 DIAGNOSIS — A4189 Other specified sepsis: Secondary | ICD-10-CM | POA: Diagnosis not present

## 2018-07-24 DIAGNOSIS — J9690 Respiratory failure, unspecified, unspecified whether with hypoxia or hypercapnia: Secondary | ICD-10-CM | POA: Diagnosis not present

## 2018-07-24 DIAGNOSIS — J80 Acute respiratory distress syndrome: Secondary | ICD-10-CM | POA: Diagnosis not present

## 2018-07-24 DIAGNOSIS — I110 Hypertensive heart disease with heart failure: Secondary | ICD-10-CM | POA: Diagnosis not present

## 2018-07-24 DIAGNOSIS — J159 Unspecified bacterial pneumonia: Secondary | ICD-10-CM | POA: Diagnosis not present

## 2018-07-24 DIAGNOSIS — R34 Anuria and oliguria: Secondary | ICD-10-CM | POA: Diagnosis not present

## 2018-07-24 DIAGNOSIS — E874 Mixed disorder of acid-base balance: Secondary | ICD-10-CM | POA: Diagnosis not present

## 2018-07-24 DIAGNOSIS — R9431 Abnormal electrocardiogram [ECG] [EKG]: Secondary | ICD-10-CM | POA: Diagnosis not present

## 2018-07-24 DIAGNOSIS — Z951 Presence of aortocoronary bypass graft: Secondary | ICD-10-CM | POA: Diagnosis not present

## 2018-07-24 DIAGNOSIS — R0602 Shortness of breath: Secondary | ICD-10-CM | POA: Diagnosis not present

## 2018-07-24 DIAGNOSIS — J189 Pneumonia, unspecified organism: Secondary | ICD-10-CM | POA: Diagnosis not present

## 2018-07-24 DIAGNOSIS — I248 Other forms of acute ischemic heart disease: Secondary | ICD-10-CM | POA: Diagnosis not present

## 2018-07-24 DIAGNOSIS — I714 Abdominal aortic aneurysm, without rupture: Secondary | ICD-10-CM | POA: Diagnosis not present

## 2018-07-24 DIAGNOSIS — E119 Type 2 diabetes mellitus without complications: Secondary | ICD-10-CM | POA: Diagnosis not present

## 2018-07-24 DIAGNOSIS — M1990 Unspecified osteoarthritis, unspecified site: Secondary | ICD-10-CM | POA: Diagnosis not present

## 2018-07-24 DIAGNOSIS — Z886 Allergy status to analgesic agent status: Secondary | ICD-10-CM | POA: Diagnosis not present

## 2018-07-24 DIAGNOSIS — R7989 Other specified abnormal findings of blood chemistry: Secondary | ICD-10-CM | POA: Diagnosis not present

## 2018-07-24 DIAGNOSIS — J1289 Other viral pneumonia: Secondary | ICD-10-CM | POA: Diagnosis not present

## 2018-07-24 DIAGNOSIS — I4892 Unspecified atrial flutter: Secondary | ICD-10-CM | POA: Diagnosis not present

## 2018-07-24 DIAGNOSIS — N170 Acute kidney failure with tubular necrosis: Secondary | ICD-10-CM | POA: Diagnosis not present

## 2018-07-24 DIAGNOSIS — R0902 Hypoxemia: Secondary | ICD-10-CM | POA: Diagnosis not present

## 2018-07-24 DIAGNOSIS — N186 End stage renal disease: Secondary | ICD-10-CM | POA: Diagnosis not present

## 2018-07-24 DIAGNOSIS — E1165 Type 2 diabetes mellitus with hyperglycemia: Secondary | ICD-10-CM | POA: Diagnosis not present

## 2018-07-24 DIAGNOSIS — I21A1 Myocardial infarction type 2: Secondary | ICD-10-CM | POA: Diagnosis not present

## 2018-07-24 DIAGNOSIS — I480 Paroxysmal atrial fibrillation: Secondary | ICD-10-CM | POA: Diagnosis not present

## 2018-07-24 DIAGNOSIS — J9691 Respiratory failure, unspecified with hypoxia: Secondary | ICD-10-CM | POA: Diagnosis not present

## 2018-07-24 DIAGNOSIS — I2699 Other pulmonary embolism without acute cor pulmonale: Secondary | ICD-10-CM | POA: Diagnosis not present

## 2018-07-24 DIAGNOSIS — E875 Hyperkalemia: Secondary | ICD-10-CM | POA: Diagnosis not present

## 2018-07-24 DIAGNOSIS — E440 Moderate protein-calorie malnutrition: Secondary | ICD-10-CM | POA: Diagnosis not present

## 2018-07-24 DIAGNOSIS — J9601 Acute respiratory failure with hypoxia: Secondary | ICD-10-CM | POA: Diagnosis not present

## 2018-07-24 DIAGNOSIS — Z888 Allergy status to other drugs, medicaments and biological substances status: Secondary | ICD-10-CM | POA: Diagnosis not present

## 2018-07-24 DIAGNOSIS — G4733 Obstructive sleep apnea (adult) (pediatric): Secondary | ICD-10-CM | POA: Diagnosis not present

## 2018-07-24 DIAGNOSIS — E872 Acidosis: Secondary | ICD-10-CM | POA: Diagnosis not present

## 2018-07-24 DIAGNOSIS — N19 Unspecified kidney failure: Secondary | ICD-10-CM | POA: Diagnosis not present

## 2018-07-24 DIAGNOSIS — E785 Hyperlipidemia, unspecified: Secondary | ICD-10-CM | POA: Diagnosis not present

## 2018-07-24 DIAGNOSIS — I5030 Unspecified diastolic (congestive) heart failure: Secondary | ICD-10-CM | POA: Diagnosis not present

## 2018-07-24 DIAGNOSIS — I441 Atrioventricular block, second degree: Secondary | ICD-10-CM | POA: Diagnosis not present

## 2018-07-24 DIAGNOSIS — R6521 Severe sepsis with septic shock: Secondary | ICD-10-CM | POA: Diagnosis not present

## 2018-07-24 DIAGNOSIS — R51 Headache: Secondary | ICD-10-CM | POA: Diagnosis not present

## 2018-07-24 DIAGNOSIS — E871 Hypo-osmolality and hyponatremia: Secondary | ICD-10-CM | POA: Diagnosis not present

## 2018-07-24 DIAGNOSIS — U071 COVID-19: Secondary | ICD-10-CM | POA: Diagnosis not present

## 2018-07-24 DIAGNOSIS — R918 Other nonspecific abnormal finding of lung field: Secondary | ICD-10-CM | POA: Diagnosis not present

## 2018-07-24 DIAGNOSIS — I5032 Chronic diastolic (congestive) heart failure: Secondary | ICD-10-CM | POA: Diagnosis not present

## 2018-07-24 DIAGNOSIS — G931 Anoxic brain damage, not elsewhere classified: Secondary | ICD-10-CM | POA: Diagnosis not present

## 2018-07-28 DIAGNOSIS — I4892 Unspecified atrial flutter: Secondary | ICD-10-CM | POA: Diagnosis not present

## 2018-07-28 DIAGNOSIS — I4439 Other atrioventricular block: Secondary | ICD-10-CM | POA: Diagnosis not present

## 2018-07-28 DIAGNOSIS — J1289 Other viral pneumonia: Secondary | ICD-10-CM | POA: Diagnosis not present

## 2018-07-28 DIAGNOSIS — I480 Paroxysmal atrial fibrillation: Secondary | ICD-10-CM | POA: Diagnosis not present

## 2018-07-28 DIAGNOSIS — R4020 Unspecified coma: Secondary | ICD-10-CM | POA: Diagnosis not present

## 2018-07-28 DIAGNOSIS — E874 Mixed disorder of acid-base balance: Secondary | ICD-10-CM | POA: Diagnosis not present

## 2018-07-28 DIAGNOSIS — E871 Hypo-osmolality and hyponatremia: Secondary | ICD-10-CM | POA: Diagnosis not present

## 2018-07-28 DIAGNOSIS — Z951 Presence of aortocoronary bypass graft: Secondary | ICD-10-CM | POA: Diagnosis not present

## 2018-07-28 DIAGNOSIS — N179 Acute kidney failure, unspecified: Secondary | ICD-10-CM | POA: Diagnosis not present

## 2018-07-28 DIAGNOSIS — Z9861 Coronary angioplasty status: Secondary | ICD-10-CM | POA: Diagnosis not present

## 2018-07-28 DIAGNOSIS — E785 Hyperlipidemia, unspecified: Secondary | ICD-10-CM | POA: Diagnosis not present

## 2018-07-28 DIAGNOSIS — R9431 Abnormal electrocardiogram [ECG] [EKG]: Secondary | ICD-10-CM | POA: Diagnosis not present

## 2018-07-28 DIAGNOSIS — U071 COVID-19: Secondary | ICD-10-CM | POA: Diagnosis not present

## 2018-07-28 DIAGNOSIS — N170 Acute kidney failure with tubular necrosis: Secondary | ICD-10-CM | POA: Diagnosis not present

## 2018-07-28 DIAGNOSIS — I21A1 Myocardial infarction type 2: Secondary | ICD-10-CM | POA: Diagnosis not present

## 2018-07-28 DIAGNOSIS — E119 Type 2 diabetes mellitus without complications: Secondary | ICD-10-CM | POA: Diagnosis not present

## 2018-07-28 DIAGNOSIS — R29818 Other symptoms and signs involving the nervous system: Secondary | ICD-10-CM | POA: Diagnosis not present

## 2018-07-28 DIAGNOSIS — I441 Atrioventricular block, second degree: Secondary | ICD-10-CM | POA: Diagnosis not present

## 2018-07-28 DIAGNOSIS — G931 Anoxic brain damage, not elsewhere classified: Secondary | ICD-10-CM | POA: Diagnosis not present

## 2018-07-28 DIAGNOSIS — A4189 Other specified sepsis: Secondary | ICD-10-CM | POA: Diagnosis not present

## 2018-07-28 DIAGNOSIS — I5032 Chronic diastolic (congestive) heart failure: Secondary | ICD-10-CM | POA: Diagnosis not present

## 2018-07-28 DIAGNOSIS — I2510 Atherosclerotic heart disease of native coronary artery without angina pectoris: Secondary | ICD-10-CM | POA: Diagnosis not present

## 2018-07-28 DIAGNOSIS — J9690 Respiratory failure, unspecified, unspecified whether with hypoxia or hypercapnia: Secondary | ICD-10-CM | POA: Diagnosis not present

## 2018-07-28 DIAGNOSIS — J9601 Acute respiratory failure with hypoxia: Secondary | ICD-10-CM | POA: Diagnosis not present

## 2018-07-28 DIAGNOSIS — R579 Shock, unspecified: Secondary | ICD-10-CM | POA: Diagnosis not present

## 2018-07-28 DIAGNOSIS — R6521 Severe sepsis with septic shock: Secondary | ICD-10-CM | POA: Diagnosis not present

## 2018-07-28 DIAGNOSIS — E875 Hyperkalemia: Secondary | ICD-10-CM | POA: Diagnosis not present

## 2018-07-28 DIAGNOSIS — J984 Other disorders of lung: Secondary | ICD-10-CM | POA: Diagnosis not present

## 2018-07-28 DIAGNOSIS — I2699 Other pulmonary embolism without acute cor pulmonale: Secondary | ICD-10-CM | POA: Diagnosis not present

## 2018-07-28 DIAGNOSIS — Z794 Long term (current) use of insulin: Secondary | ICD-10-CM | POA: Diagnosis not present

## 2018-07-29 DIAGNOSIS — N179 Acute kidney failure, unspecified: Secondary | ICD-10-CM | POA: Diagnosis not present

## 2018-07-29 DIAGNOSIS — R579 Shock, unspecified: Secondary | ICD-10-CM | POA: Diagnosis not present

## 2018-07-29 DIAGNOSIS — R29818 Other symptoms and signs involving the nervous system: Secondary | ICD-10-CM | POA: Diagnosis not present

## 2018-07-29 DIAGNOSIS — J9601 Acute respiratory failure with hypoxia: Secondary | ICD-10-CM | POA: Diagnosis not present

## 2018-07-29 DIAGNOSIS — U071 COVID-19: Secondary | ICD-10-CM | POA: Diagnosis not present

## 2018-07-29 DIAGNOSIS — J9690 Respiratory failure, unspecified, unspecified whether with hypoxia or hypercapnia: Secondary | ICD-10-CM | POA: Diagnosis not present

## 2018-07-30 DIAGNOSIS — R579 Shock, unspecified: Secondary | ICD-10-CM | POA: Diagnosis not present

## 2018-07-30 DIAGNOSIS — U071 COVID-19: Secondary | ICD-10-CM | POA: Diagnosis not present

## 2018-07-30 DIAGNOSIS — R9431 Abnormal electrocardiogram [ECG] [EKG]: Secondary | ICD-10-CM | POA: Diagnosis not present

## 2018-07-30 DIAGNOSIS — I4439 Other atrioventricular block: Secondary | ICD-10-CM | POA: Diagnosis not present

## 2018-07-30 DIAGNOSIS — I441 Atrioventricular block, second degree: Secondary | ICD-10-CM | POA: Diagnosis not present

## 2018-07-30 DIAGNOSIS — N179 Acute kidney failure, unspecified: Secondary | ICD-10-CM | POA: Diagnosis not present

## 2018-07-30 DIAGNOSIS — I4892 Unspecified atrial flutter: Secondary | ICD-10-CM | POA: Diagnosis not present

## 2018-07-30 DIAGNOSIS — J9690 Respiratory failure, unspecified, unspecified whether with hypoxia or hypercapnia: Secondary | ICD-10-CM | POA: Diagnosis not present

## 2018-07-30 DIAGNOSIS — J9601 Acute respiratory failure with hypoxia: Secondary | ICD-10-CM | POA: Diagnosis not present

## 2018-07-31 DIAGNOSIS — U071 COVID-19: Secondary | ICD-10-CM | POA: Diagnosis not present

## 2018-07-31 DIAGNOSIS — J9690 Respiratory failure, unspecified, unspecified whether with hypoxia or hypercapnia: Secondary | ICD-10-CM | POA: Diagnosis not present

## 2018-07-31 DIAGNOSIS — N179 Acute kidney failure, unspecified: Secondary | ICD-10-CM | POA: Diagnosis not present

## 2018-07-31 DIAGNOSIS — J9601 Acute respiratory failure with hypoxia: Secondary | ICD-10-CM | POA: Diagnosis not present

## 2018-07-31 DIAGNOSIS — J984 Other disorders of lung: Secondary | ICD-10-CM | POA: Diagnosis not present

## 2018-07-31 DIAGNOSIS — R579 Shock, unspecified: Secondary | ICD-10-CM | POA: Diagnosis not present

## 2018-08-01 DIAGNOSIS — I441 Atrioventricular block, second degree: Secondary | ICD-10-CM | POA: Diagnosis not present

## 2018-08-01 DIAGNOSIS — R9431 Abnormal electrocardiogram [ECG] [EKG]: Secondary | ICD-10-CM | POA: Diagnosis not present

## 2018-08-01 DIAGNOSIS — R579 Shock, unspecified: Secondary | ICD-10-CM | POA: Diagnosis not present

## 2018-08-01 DIAGNOSIS — U071 COVID-19: Secondary | ICD-10-CM | POA: Diagnosis not present

## 2018-08-01 DIAGNOSIS — N179 Acute kidney failure, unspecified: Secondary | ICD-10-CM | POA: Diagnosis not present

## 2018-08-01 DIAGNOSIS — I4892 Unspecified atrial flutter: Secondary | ICD-10-CM | POA: Diagnosis not present

## 2018-08-01 DIAGNOSIS — J9601 Acute respiratory failure with hypoxia: Secondary | ICD-10-CM | POA: Diagnosis not present

## 2018-08-01 DIAGNOSIS — J9690 Respiratory failure, unspecified, unspecified whether with hypoxia or hypercapnia: Secondary | ICD-10-CM | POA: Diagnosis not present

## 2018-08-11 DEATH — deceased
